# Patient Record
Sex: Male | Born: 1935 | Race: White | Hispanic: No | Marital: Married | Smoking: Former smoker
Health system: Southern US, Community
[De-identification: ages and names within clinical notes are randomized; demographics above are authoritative.]

## PROBLEM LIST (undated history)

## (undated) DIAGNOSIS — E875 Hyperkalemia: Secondary | ICD-10-CM

## (undated) DIAGNOSIS — I872 Venous insufficiency (chronic) (peripheral): Secondary | ICD-10-CM

## (undated) DIAGNOSIS — I10 Essential (primary) hypertension: Secondary | ICD-10-CM

## (undated) DIAGNOSIS — M1990 Unspecified osteoarthritis, unspecified site: Secondary | ICD-10-CM

## (undated) DIAGNOSIS — I5189 Other ill-defined heart diseases: Secondary | ICD-10-CM

## (undated) DIAGNOSIS — IMO0001 Reserved for inherently not codable concepts without codable children: Secondary | ICD-10-CM

## (undated) DIAGNOSIS — H44009 Unspecified purulent endophthalmitis, unspecified eye: Secondary | ICD-10-CM

## (undated) DIAGNOSIS — I2510 Atherosclerotic heart disease of native coronary artery without angina pectoris: Secondary | ICD-10-CM

## (undated) HISTORY — DX: Unspecified purulent endophthalmitis, unspecified eye: H44.009

## (undated) HISTORY — PX: TOTAL KNEE ARTHROPLASTY: SHX125

## (undated) HISTORY — PX: ENUCLEATION: SHX628

## (undated) HISTORY — PX: GALLBLADDER SURGERY: SHX652

## (undated) HISTORY — DX: Essential (primary) hypertension: I10

## (undated) HISTORY — DX: Other ill-defined heart diseases: I51.89

## (undated) HISTORY — PX: HERNIA REPAIR: SHX51

## (undated) HISTORY — DX: Hyperkalemia: E87.5

## (undated) HISTORY — PX: PROSTATE SURGERY: SHX751

## (undated) HISTORY — DX: Venous insufficiency (chronic) (peripheral): I87.2

## (undated) HISTORY — DX: Morbid (severe) obesity due to excess calories: E66.01

---

## 1999-11-07 ENCOUNTER — Encounter: Payer: Self-pay | Admitting: Emergency Medicine

## 1999-11-07 ENCOUNTER — Emergency Department (HOSPITAL_COMMUNITY): Admission: EM | Admit: 1999-11-07 | Discharge: 1999-11-07 | Payer: Self-pay | Admitting: Emergency Medicine

## 2000-05-02 ENCOUNTER — Ambulatory Visit (HOSPITAL_COMMUNITY): Admission: RE | Admit: 2000-05-02 | Discharge: 2000-05-03 | Payer: Self-pay | Admitting: Ophthalmology

## 2001-07-02 ENCOUNTER — Encounter: Payer: Self-pay | Admitting: Ophthalmology

## 2001-07-02 ENCOUNTER — Ambulatory Visit (HOSPITAL_COMMUNITY): Admission: RE | Admit: 2001-07-02 | Discharge: 2001-07-02 | Payer: Self-pay | Admitting: Ophthalmology

## 2002-08-19 ENCOUNTER — Encounter: Payer: Self-pay | Admitting: Ophthalmology

## 2002-08-20 ENCOUNTER — Ambulatory Visit (HOSPITAL_COMMUNITY): Admission: RE | Admit: 2002-08-20 | Discharge: 2002-08-21 | Payer: Self-pay | Admitting: Ophthalmology

## 2002-11-12 ENCOUNTER — Ambulatory Visit (HOSPITAL_COMMUNITY): Admission: RE | Admit: 2002-11-12 | Discharge: 2002-11-12 | Payer: Self-pay | Admitting: Ophthalmology

## 2003-03-11 ENCOUNTER — Ambulatory Visit (HOSPITAL_COMMUNITY): Admission: RE | Admit: 2003-03-11 | Discharge: 2003-03-12 | Payer: Self-pay | Admitting: General Surgery

## 2003-03-11 ENCOUNTER — Encounter (INDEPENDENT_AMBULATORY_CARE_PROVIDER_SITE_OTHER): Payer: Self-pay | Admitting: *Deleted

## 2003-04-28 ENCOUNTER — Encounter: Admission: RE | Admit: 2003-04-28 | Discharge: 2003-04-28 | Payer: Self-pay | Admitting: Family Medicine

## 2004-12-28 ENCOUNTER — Inpatient Hospital Stay (HOSPITAL_BASED_OUTPATIENT_CLINIC_OR_DEPARTMENT_OTHER): Admission: RE | Admit: 2004-12-28 | Discharge: 2004-12-28 | Payer: Self-pay | Admitting: Cardiovascular Disease

## 2005-04-21 ENCOUNTER — Emergency Department (HOSPITAL_COMMUNITY): Admission: EM | Admit: 2005-04-21 | Discharge: 2005-04-21 | Payer: Self-pay | Admitting: Emergency Medicine

## 2006-06-03 ENCOUNTER — Ambulatory Visit: Payer: Self-pay | Admitting: Vascular Surgery

## 2007-12-22 ENCOUNTER — Ambulatory Visit: Payer: Self-pay | Admitting: *Deleted

## 2007-12-28 ENCOUNTER — Ambulatory Visit (HOSPITAL_COMMUNITY): Admission: RE | Admit: 2007-12-28 | Discharge: 2007-12-28 | Payer: Self-pay | Admitting: Cardiovascular Disease

## 2007-12-28 ENCOUNTER — Encounter: Payer: Self-pay | Admitting: Cardiovascular Disease

## 2008-11-02 ENCOUNTER — Emergency Department (HOSPITAL_COMMUNITY): Admission: EM | Admit: 2008-11-02 | Discharge: 2008-11-02 | Payer: Self-pay | Admitting: Emergency Medicine

## 2008-11-15 ENCOUNTER — Ambulatory Visit: Payer: Self-pay | Admitting: Vascular Surgery

## 2008-11-15 ENCOUNTER — Encounter (INDEPENDENT_AMBULATORY_CARE_PROVIDER_SITE_OTHER): Payer: Self-pay | Admitting: Orthopedic Surgery

## 2008-11-15 ENCOUNTER — Ambulatory Visit: Admission: RE | Admit: 2008-11-15 | Discharge: 2008-11-15 | Payer: Self-pay | Admitting: Orthopedic Surgery

## 2008-11-18 ENCOUNTER — Ambulatory Visit (HOSPITAL_COMMUNITY): Admission: RE | Admit: 2008-11-18 | Discharge: 2008-11-20 | Payer: Self-pay | Admitting: Orthopedic Surgery

## 2009-05-11 ENCOUNTER — Encounter: Admission: RE | Admit: 2009-05-11 | Discharge: 2009-05-11 | Payer: Self-pay | Admitting: Orthopedic Surgery

## 2009-05-16 ENCOUNTER — Inpatient Hospital Stay (HOSPITAL_COMMUNITY): Admission: RE | Admit: 2009-05-16 | Discharge: 2009-05-19 | Payer: Self-pay | Admitting: Orthopedic Surgery

## 2009-05-18 DIAGNOSIS — M86119 Other acute osteomyelitis, unspecified shoulder: Secondary | ICD-10-CM | POA: Insufficient documentation

## 2009-05-18 DIAGNOSIS — E875 Hyperkalemia: Secondary | ICD-10-CM | POA: Insufficient documentation

## 2009-05-18 DIAGNOSIS — D649 Anemia, unspecified: Secondary | ICD-10-CM | POA: Insufficient documentation

## 2009-05-19 ENCOUNTER — Ambulatory Visit: Payer: Self-pay | Admitting: Infectious Diseases

## 2009-05-19 ENCOUNTER — Encounter (INDEPENDENT_AMBULATORY_CARE_PROVIDER_SITE_OTHER): Payer: Self-pay | Admitting: Orthopedic Surgery

## 2009-05-19 ENCOUNTER — Ambulatory Visit: Payer: Self-pay | Admitting: Vascular Surgery

## 2009-05-25 ENCOUNTER — Encounter: Payer: Self-pay | Admitting: Infectious Diseases

## 2009-06-01 ENCOUNTER — Encounter: Payer: Self-pay | Admitting: Infectious Diseases

## 2009-06-07 ENCOUNTER — Encounter: Payer: Self-pay | Admitting: Infectious Diseases

## 2009-06-08 ENCOUNTER — Encounter: Payer: Self-pay | Admitting: Infectious Diseases

## 2009-06-15 ENCOUNTER — Encounter: Payer: Self-pay | Admitting: Infectious Diseases

## 2009-06-20 ENCOUNTER — Encounter (INDEPENDENT_AMBULATORY_CARE_PROVIDER_SITE_OTHER): Payer: Self-pay | Admitting: *Deleted

## 2009-06-20 DIAGNOSIS — N184 Chronic kidney disease, stage 4 (severe): Secondary | ICD-10-CM | POA: Insufficient documentation

## 2009-06-20 DIAGNOSIS — E785 Hyperlipidemia, unspecified: Secondary | ICD-10-CM | POA: Insufficient documentation

## 2009-06-20 DIAGNOSIS — E109 Type 1 diabetes mellitus without complications: Secondary | ICD-10-CM | POA: Insufficient documentation

## 2009-06-20 DIAGNOSIS — E669 Obesity, unspecified: Secondary | ICD-10-CM | POA: Insufficient documentation

## 2009-06-20 DIAGNOSIS — I10 Essential (primary) hypertension: Secondary | ICD-10-CM | POA: Insufficient documentation

## 2009-06-21 ENCOUNTER — Ambulatory Visit: Payer: Self-pay | Admitting: Infectious Diseases

## 2009-06-21 DIAGNOSIS — E119 Type 2 diabetes mellitus without complications: Secondary | ICD-10-CM | POA: Insufficient documentation

## 2009-06-21 DIAGNOSIS — N19 Unspecified kidney failure: Secondary | ICD-10-CM | POA: Insufficient documentation

## 2009-07-20 ENCOUNTER — Telehealth: Payer: Self-pay | Admitting: Infectious Disease

## 2009-08-07 ENCOUNTER — Ambulatory Visit: Payer: Self-pay | Admitting: Infectious Diseases

## 2009-08-11 LAB — CONVERTED CEMR LAB
BUN: 38 mg/dL — ABNORMAL HIGH (ref 6–23)
Basophils Absolute: 0 10*3/uL (ref 0.0–0.1)
Basophils Relative: 1 % (ref 0–1)
CRP: 0.1 mg/dL (ref <0.6)
Chloride: 105 meq/L (ref 96–112)
Creatinine, Ser: 1.87 mg/dL — ABNORMAL HIGH (ref 0.40–1.50)
Eosinophils Absolute: 0.5 10*3/uL (ref 0.0–0.7)
Glucose, Bld: 182 mg/dL — ABNORMAL HIGH (ref 70–99)
HCT: 41.3 % (ref 39.0–52.0)
Hemoglobin: 13.2 g/dL (ref 13.0–17.0)
MCHC: 32 g/dL (ref 30.0–36.0)
MCV: 96.9 fL (ref 78.0–100.0)
Platelets: 179 10*3/uL (ref 150–400)
RDW: 14.1 % (ref 11.5–15.5)
Sodium: 142 meq/L (ref 135–145)

## 2009-08-16 ENCOUNTER — Telehealth (INDEPENDENT_AMBULATORY_CARE_PROVIDER_SITE_OTHER): Payer: Self-pay | Admitting: *Deleted

## 2009-08-29 ENCOUNTER — Encounter: Admission: RE | Admit: 2009-08-29 | Discharge: 2009-08-29 | Payer: Self-pay | Admitting: Orthopedic Surgery

## 2009-10-16 ENCOUNTER — Ambulatory Visit: Payer: Self-pay | Admitting: Cardiovascular Disease

## 2009-10-16 LAB — LIPID PANEL
Cholesterol: 160 mg/dL (ref 0–200)
HDL: 43 mg/dL (ref 35–70)
LDL Cholesterol: 79 mg/dL
Triglycerides: 191 mg/dL — AB (ref 40–160)

## 2010-01-08 ENCOUNTER — Ambulatory Visit: Payer: Self-pay | Admitting: Cardiovascular Disease

## 2010-01-08 ENCOUNTER — Encounter: Payer: Self-pay | Admitting: Family Medicine

## 2010-02-22 LAB — BASIC METABOLIC PANEL
BUN: 30 mg/dL — AB (ref 4–21)
Creatinine: 1.9 mg/dL — AB (ref <=1.3)
Glucose: 59 mg/dL

## 2010-02-22 LAB — TSH: TSH: 0.73 u[IU]/mL (ref <=5.90)

## 2010-02-23 ENCOUNTER — Ambulatory Visit: Payer: Self-pay | Admitting: Cardiovascular Disease

## 2010-03-08 ENCOUNTER — Encounter: Payer: Self-pay | Admitting: Cardiovascular Disease

## 2010-03-08 DIAGNOSIS — I5032 Chronic diastolic (congestive) heart failure: Secondary | ICD-10-CM | POA: Insufficient documentation

## 2010-03-08 DIAGNOSIS — I872 Venous insufficiency (chronic) (peripheral): Secondary | ICD-10-CM | POA: Insufficient documentation

## 2010-03-08 DIAGNOSIS — I10 Essential (primary) hypertension: Secondary | ICD-10-CM | POA: Insufficient documentation

## 2010-03-08 DIAGNOSIS — E875 Hyperkalemia: Secondary | ICD-10-CM | POA: Insufficient documentation

## 2010-03-08 DIAGNOSIS — E119 Type 2 diabetes mellitus without complications: Secondary | ICD-10-CM | POA: Insufficient documentation

## 2010-03-08 DIAGNOSIS — Z905 Acquired absence of kidney: Secondary | ICD-10-CM | POA: Insufficient documentation

## 2010-03-08 DIAGNOSIS — H44009 Unspecified purulent endophthalmitis, unspecified eye: Secondary | ICD-10-CM | POA: Insufficient documentation

## 2010-03-11 ENCOUNTER — Encounter: Payer: Self-pay | Admitting: Cardiovascular Disease

## 2010-03-20 NOTE — Progress Notes (Signed)
Summary: Pt called symptoms returning.  Had stopped abx 5/19, to resume  Phone Note Call from Patient   Caller: Patient Summary of Call: Voicemail : having increased pain in arm.  left message to call. Tomasita Morrow RN  July 20, 2009 10:35 AM   Follow-up for Phone Call        Pt. called again.  Wanting to know whether Dr. Ninetta Lights would like him to have any blood work since the same symptoms have returned that started his shoulder problem.  RN to contact MD and call pt. back.  Pt. ph. # [phone].  Jennet Maduro RN  July 20, 2009 11:19 AM SPoke with Dr. Daiva Eves.  Pt. needs to call his orthopedist to return.  RN called pt.  Pt. saw the orthopedist in May and was released.  RN asked the pt. whether he was still taking the Cipro.  Pt. stated that he completed 15 days of Cipro and stopped.  RN asked why he did not receive refills of the medication.  Pt. was told by [facility] that he did not have refills.  RN will call [facility] to check on refills and order if necessary.  Will page Dr Daiva Eves about ordering any labwork which the pt. feels is necessary.  RN stated that she would call the pt. back today. Jennet Maduro RN  July 20, 2009 12:40 PM RN spoke with Dr. Algis Liming.  Doesn't feel that the pt. needs labwork. Wants the pt. to resume taking the antibiotic ASAP, make appt.with Dr. Ranell Patrick for exam and return with Dr. Ninetta Lights on 08/07/09.  RN called pt. and relayed these instructions.  Pt. verbalized understanding. Jennet Maduro RN  July 20, 2009 1:00 PM   Additional Follow-up for Phone Call Additional follow up Details #1::        Thanks [facility]  Additional Follow-up by: Acey Lav MD,  July 21, 2009 2:49 PM

## 2010-03-20 NOTE — Assessment & Plan Note (Signed)
Summary: 6WK F/U/VS   CC:  6 week follow up.  History of Present Illness: 77 yo M with hx of R shoulder rotator cuff repair Oct 2010. He returned March 2011 with decreased range of motion and pain. He had an MRI showing " Intense marrow edema about 2 screws in the proximal humerus worrisome for osteomyelitis." he was adm to the hospital on 05-16-09 and underwent I & D same day. His Cx grew P aeruginosa (pan-sensitive).  ESR 121, CRP 23.8 (05-19-09).  He was treated with IV vancomycin for ? date. He would complete 6 weeks from d/c at Jun 27, 2009.  F/U ESR 2 and CRP 0.2 06-08-09.  he tokk only 14 days of cipro and then had recurrent pain. he called the ID clinic and his cipro was refilled (has 10 days remaining). and he was to f/u with his orthopedist. and has since had further surgery on his L eye 07-28-09.  has some continued pain in his shoulder especailly with leaning on it. it "cracks" when he does this. no fever or chills, no swelling in shoulder. pain in shoulder resolves with rotation of shoulder. states he has FROM.  has f/u with his kidney dr at [facility] and most Cr was 2.5 .    Preventive Screening-Counseling & Management  Alcohol-Tobacco     Alcohol drinks/day: 0     Smoking Status: quit  Caffeine-Diet-Exercise     Caffeine use/day: coffee     Does Patient Exercise: yes     Type of exercise: PT done by patient  Safety-Violence-Falls     Seat Belt Use: yes   Updated Prior Medication List: LANTUS 100 UNIT/ML SOLN (INSULIN GLARGINE) Inject 25 units subcutaneously every morning per PCP NOVOLOG 100 UNIT/ML SOLN (INSULIN ASPART) Inject 10 units three times a day before meals per PCP FUROSEMIDE 80 MG TABS (FUROSEMIDE) Take 1 tablet by mouth once a day in the AM per PCP CARVEDILOL 12.5 MG TABS (CARVEDILOL) Take 1 tablet by mouth two times a day per PCP LISINOPRIL 20 MG TABS (LISINOPRIL) Take 1 tablet by mouth two times a day per PCP MULTIVITAMINS  TABS (MULTIPLE VITAMIN) Take 1 tablet by  mouth once a day per PCP ASPIRIN 81 MG TABS (ASPIRIN) Take 1 tablet by mouth once a day per PCP SIMVASTATIN 40 MG TABS (SIMVASTATIN) Take 1 tablet by mouth at bedtime per PCP HECTOROL 0.5 MCG CAPS (DOXERCALCIFEROL) Take one capsule by mouth on Monday, Wednesday and Friday per PCP CIPRO 500 MG TABS (CIPROFLOXACIN HCL) Take 1 tablet by mouth two times a day  Current Allergies (reviewed today): ! NEOMYCIN-POLYMYXIN-DEXAMETH [facility]) Past History:  Past medical, surgical, family and social histories (including risk factors) reviewed, and no changes noted (except as noted below).  Past Medical History: Reviewed history from 06/21/2009 and no changes required. Current Problems:  RENAL FAILURE (ICD-586) DIABETES MELLITUS, TYPE II (ICD-250.00) HYPERLIPIDEMIA (ICD-272.4) OBESITY (ICD-278.00) HYPERTENSION (ICD-401.9) ANEMIA (ICD-285.9) CHRONIC KIDNEY DISEASE STAGE III (MODERATE) (ICD-585.3) HYPERKALEMIA (ICD-276.7) IDDM (ICD-250.01) ACUTE OSTEOMYELITIS SHOULDER REGION (ICD-730.01) Blind L eye  Family History: Reviewed history from 06/21/2009 and no changes required. Family History of Alcoholism/Addiction- father Family History Diabetes 1st degree relative- mother  Social History: Reviewed history from 06/21/2009 and no changes required. Former Smoker Alcohol use-yes- occasional  Vital Signs:  Patient profile:   77 year old male Height:      63 inches (160.02 cm) Weight:      246.12 pounds (111.87 kg) BMI:     43.76 Temp:  97.7 degrees F (36.50 degrees C) oral Pulse rate:   60 / minute BP sitting:   143 / 80  (left arm)  Vitals Entered By: Baxter Hire) (August 07, 2009 10:49 AM) CC: 6 week follow up Pain Assessment Patient in pain? no      Nutritional Status BMI of > 30 = obese Nutritional Status Detail appetite is okay per patient  Does patient need assistance? Functional Status Self care Ambulation Normal   Physical Exam  General:   well-developed, well-nourished, well-hydrated, and overweight-appearing.   Extremities:  R shoulder non-tender, FROM, no increase in heat, no swelling.    Impression & Recommendations:  Problem # 1:  ACUTE OSTEOMYELITIS SHOULDER REGION (ICD-730.01)  will recheck his CRP and ESR and WBC.  will plan to stop his meds at planned d/c (10 days from today) if his CRP and ESR are normal. If they are elavated with continue his meds. will cal lpt with results if abn.  His most recent HgBAIC was 7%. Has had 2 hypoglycemic episodes in last. has f/u with endo His updated medication list for this problem includes:    Aspirin 81 Mg Tabs (Aspirin) .Marland Kitchen... Take 1 tablet by mouth once a day per pcp    Cipro 500 Mg Tabs (Ciprofloxacin hcl) .Marland Kitchen... Take 1 tablet by mouth two times a day  Orders: T-C-Reactive Protein [phone]) T-CBC w/Diff [phone]) T-Basic Metabolic Panel [phone]) T-Sed Rate (Automated) ([id number]) Est. Patient Level III (40102)

## 2010-03-20 NOTE — Letter (Signed)
Summary: [facility]   Imported By: Maryln Gottron 01/19/2010 10:29:37  _____________________________________________________________________  External Attachment:    Type:   Image     Comment:   External Document

## 2010-03-20 NOTE — Miscellaneous (Signed)
Summary: Problems, Medications and Allergies updated  Clinical Lists Changes  Problems: Added new problem of ACUTE OSTEOMYELITIS SHOULDER REGION (ICD-730.01) - right shoulder Added new problem of IDDM (ICD-250.01) Added new problem of HYPERKALEMIA (ICD-276.7) Added new problem of CHRONIC KIDNEY DISEASE STAGE III (MODERATE) (ICD-585.3) Added new problem of ANEMIA (ICD-285.9) Added new problem of HYPERTENSION (ICD-401.9) Added new problem of OBESITY (ICD-278.00) Added new problem of HYPERLIPIDEMIA (ICD-272.4) Medications: Added new medication of LANTUS 100 UNIT/ML SOLN (INSULIN GLARGINE) Inject 25 units subcutaneously every morning per PCP Added new medication of NOVOLOG 100 UNIT/ML SOLN (INSULIN ASPART) Inject 10 units three times a day before meals per PCP Added new medication of FUROSEMIDE 80 MG TABS (FUROSEMIDE) Take 1 tablet by mouth once a day in the AM per PCP Added new medication of CARVEDILOL 12.5 MG TABS (CARVEDILOL) Take 1 tablet by mouth two times a day per PCP Added new medication of LISINOPRIL 20 MG TABS (LISINOPRIL) Take 1 tablet by mouth two times a day per PCP Added new medication of MULTIVITAMINS  TABS (MULTIPLE VITAMIN) Take 1 tablet by mouth once a day per PCP Added new medication of ASPIRIN 81 MG TABS (ASPIRIN) Take 1 tablet by mouth once a day per PCP Added new medication of SIMVASTATIN 40 MG TABS (SIMVASTATIN) Take 1 tablet by mouth at bedtime per PCP Added new medication of HECTOROL 0.5 MCG CAPS (DOXERCALCIFEROL) Take one capsule by mouth on Monday, Wednesday and Friday per PCP Allergies: Added new allergy or adverse reaction of NEOMYCIN-POLYMYXIN-DEXAMETH [facility]) Observations: Added new observation of NKA: F (06/20/2009 9:22)

## 2010-03-20 NOTE — Miscellaneous (Signed)
Summary: HIPAA Restrictions  HIPAA Restrictions   Imported By: Florinda Marker 06/21/2009 14:44:56  _____________________________________________________________________  External Attachment:    Type:   Image     Comment:   External Document

## 2010-03-20 NOTE — Progress Notes (Signed)
Summary: Call to inform pt. of labwork  Phone Note Outgoing Call   Call placed by: Jennet Maduro RN,  August 16, 2009 4:01 PM Call placed to: Patient Action Taken: Phone Call Completed Summary of Call: Call to pt. to inform about lab work.  Pt. had previously been call by this office and was instructed to stop his medications 2 days ago.  RN apologized for the confusion about when to stop his antibiotics.  Pt. stated he would watch how his shoulder felt and would call this office if there were a problem.  Jennet Maduro RN  August 16, 2009 4:04 PM

## 2010-03-20 NOTE — Assessment & Plan Note (Signed)
Summary: hsfu/need chart/kam   Vital Signs:  Patient profile:   77 year old male Height:      63 inches (160.02 cm) Weight:      244.12 pounds (110.96 kg) BMI:     43.40 Temp:     97.8 degrees F (36.56 degrees C) oral Pulse rate:   83 / minute BP sitting:   131 / 75  (right arm)  Vitals Entered By: Baxter Hire) (Jun 21, 2009 11:26 AM) CC: hsfu Pain Assessment Patient in pain? yes     Location: right shoulder Intensity: will not answer Onset of pain  pain comes and goes Nutritional Status BMI of > 30 = obese Nutritional Status Detail appetite is okay per patient  Does patient need assistance? Functional Status Self care Ambulation Normal   CC:  hsfu.  History of Present Illness: 77 yo M with hx of R shoulder rotator cuff repair Oct 2010. He returned March 2011 with decreased range of motion and pain. He had an MRI showing " Intense marrow edema about 2 screws in the proximal humerus worrisome for osteomyelitis." he was adm to the hospital on 05-16-09 and underwent I & D same day. His Cx grew P aeruginosa (pan-sensitive).  ESR 121, CRP 23.8 (05-19-09).  He was treated with IV vancomycin for ? date. He would complete 6 weeks from d/c at Jun 27, 2009.  Cont to have some pain in his shoulder, occas. Has had some redness in his PIC, occas swelling.    F/U ESR 2 and CRP 0.2 06-08-09.   Preventive Screening-Counseling & Management  Alcohol-Tobacco     Alcohol drinks/day: 0     Smoking Status: quit  Caffeine-Diet-Exercise     Caffeine use/day: coffee     Does Patient Exercise: yes     Type of exercise: PT done by patient  Safety-Violence-Falls     Seat Belt Use: yes  Current Medications (verified): 1)  Lantus 100 Unit/ml Soln (Insulin Glargine) .... Inject 25 Units Subcutaneously Every Morning Per Pcp 2)  Novolog 100 Unit/ml Soln (Insulin Aspart) .... Inject 10 Units Three Times A Day Before Meals Per Pcp 3)  Furosemide 80 Mg Tabs (Furosemide) .... Take 1 Tablet By  Mouth Once A Day in The Am Per Pcp 4)  Carvedilol 12.5 Mg Tabs (Carvedilol) .... Take 1 Tablet By Mouth Two Times A Day Per Pcp 5)  Lisinopril 20 Mg Tabs (Lisinopril) .... Take 1 Tablet By Mouth Two Times A Day Per Pcp 6)  Multivitamins  Tabs (Multiple Vitamin) .... Take 1 Tablet By Mouth Once A Day Per Pcp 7)  Aspirin 81 Mg Tabs (Aspirin) .... Take 1 Tablet By Mouth Once A Day Per Pcp 8)  Simvastatin 40 Mg Tabs (Simvastatin) .... Take 1 Tablet By Mouth At Bedtime Per Pcp 9)  Hectorol 0.5 Mcg Caps (Doxercalciferol) .... Take One Capsule By Mouth On Monday, Wednesday and Friday Per Pcp 10)  Vancomycin Hcl 1000 Mg Solr (Vancomycin Hcl)  Allergies: 1)  ! Neomycin-Polymyxin-Dexameth (Neomycin-Polymyxin-Dexameth)  Past History:  Past Medical History: Current Problems:  RENAL FAILURE (ICD-586) DIABETES MELLITUS, TYPE II (ICD-250.00) HYPERLIPIDEMIA (ICD-272.4) OBESITY (ICD-278.00) HYPERTENSION (ICD-401.9) ANEMIA (ICD-285.9) CHRONIC KIDNEY DISEASE STAGE III (MODERATE) (ICD-585.3) HYPERKALEMIA (ICD-276.7) IDDM (ICD-250.01) ACUTE OSTEOMYELITIS SHOULDER REGION (ICD-730.01) Blind L eye  Family History: Family History of Alcoholism/Addiction- father Family History Diabetes 1st degree relative- mother  Social History: Former Smoker Alcohol use-yes- occasional  Review of Systems  The patient denies fever.  chills, worsening vision in R eye.   Physical Exam  General:  well-developed, well-nourished, and well-hydrated.   Eyes:  pupils equal and pupils round.   Mouth:  pharynx pink and moist and no exudates.   Lungs:  normal respiratory effort and normal breath sounds.   Heart:  normal rate, regular rhythm, and no murmur.   Abdomen:  soft, non-tender, and normal bowel sounds.   Extremities:  minimal erythema at [facility]. no d/c.  R shoulder- non-tender, no erythema, no fluctuance, some limitation of motion to adduction superiorly.    Impression & Recommendations:  Problem  # 1:  ACUTE OSTEOMYELITIS SHOULDER REGION (ICD-730.01)  His cx showed Pseudomonas but he was treated with vanco and has improved dramatically. the records that are avaible in the clinic are limited to the hospital computer only.  will add 6 week course of by mouth cipro to his meds. will see him back in 6 weeks to see his progress.  pull pic today.  His updated medication list for this problem includes:    Aspirin 81 Mg Tabs (Aspirin) .Marland Kitchen... Take 1 tablet by mouth once a day per pcp    Vancomycin Hcl 1000 Mg Solr (Vancomycin hcl)    Cipro 500 Mg Tabs (Ciprofloxacin hcl) .Marland Kitchen... Take 1 tablet by mouth two times a day  Orders: New Patient Level IV (06269)  Medications Added to Medication List This Visit: 1)  Vancomycin Hcl 1000 Mg Solr (Vancomycin hcl) 2)  Cipro 500 Mg Tabs (Ciprofloxacin hcl) .... Take 1 tablet by mouth two times a day Prescriptions: CIPRO 500 MG TABS (CIPROFLOXACIN HCL) Take 1 tablet by mouth two times a day  #30 x 2   Entered and Authorized by:   Johny Sax MD   Signed by:   Johny Sax MD on 06/21/2009   Method used:   Print then Give to Patient   RxID:   [id number]    Appended Document: hsfu/need chart/kam PIC dressing removed, site unremarkable.  Pt. taking ASA 81 mg.  PIC line removed using sterile procedure @ 12:42 pm  PIC length equal to that noted in pt's hospital chart of 52.5 cm.  Sterile petroleum gauze + sterile 4X4 applied to PIC site, pressure applied for 8 minutes and covered with Medipore take as a pressure bandage.  Pt. instructed to limit use of arm for 1 hour.  Pt. instructed the the pressure dressing should remain in place for 24 hours.  Pt. and wife verablized understanding of these instructions.

## 2010-03-30 ENCOUNTER — Ambulatory Visit (INDEPENDENT_AMBULATORY_CARE_PROVIDER_SITE_OTHER): Payer: Medicare Other | Admitting: Cardiovascular Disease

## 2010-03-30 DIAGNOSIS — E7800 Pure hypercholesterolemia, unspecified: Secondary | ICD-10-CM

## 2010-03-30 DIAGNOSIS — I119 Hypertensive heart disease without heart failure: Secondary | ICD-10-CM

## 2010-03-30 DIAGNOSIS — E119 Type 2 diabetes mellitus without complications: Secondary | ICD-10-CM

## 2010-05-09 LAB — GLUCOSE, CAPILLARY
Glucose-Capillary: 129 mg/dL — ABNORMAL HIGH (ref 70–99)
Glucose-Capillary: 152 mg/dL — ABNORMAL HIGH (ref 70–99)

## 2010-05-09 LAB — DIFFERENTIAL
Basophils Relative: 1 % (ref 0–1)
Eosinophils Absolute: 0.5 10*3/uL (ref 0.0–0.7)
Eosinophils Relative: 7 % — ABNORMAL HIGH (ref 0–5)
Lymphocytes Relative: 8 % — ABNORMAL LOW (ref 12–46)
Monocytes Absolute: 0.9 10*3/uL (ref 0.1–1.0)
Neutrophils Relative %: 71 % (ref 43–77)

## 2010-05-09 LAB — BASIC METABOLIC PANEL
BUN: 51 mg/dL — ABNORMAL HIGH (ref 6–23)
CO2: 25 mEq/L (ref 19–32)
GFR calc Af Amer: 37 mL/min — ABNORMAL LOW (ref >60)
GFR calc non Af Amer: 30 mL/min — ABNORMAL LOW (ref >60)
Glucose, Bld: 149 mg/dL — ABNORMAL HIGH (ref 70–99)
Potassium: 4.9 mEq/L (ref 3.5–5.1)
Potassium: 5 mEq/L (ref 3.5–5.1)
Sodium: 136 mEq/L (ref 135–145)
Sodium: 138 mEq/L (ref 135–145)

## 2010-05-09 LAB — CBC
HCT: 30.3 % — ABNORMAL LOW (ref 39.0–52.0)
MCV: 95.4 fL (ref 78.0–100.0)
Platelets: 233 10*3/uL (ref 150–400)
RDW: 13.5 % (ref 11.5–15.5)
WBC: 6.4 10*3/uL (ref 4.0–10.5)

## 2010-05-09 LAB — SEDIMENTATION RATE: Sed Rate: 121 mm/hr — ABNORMAL HIGH (ref 0–16)

## 2010-05-09 LAB — VANCOMYCIN, TROUGH: Vancomycin Tr: 27 ug/mL (ref 10.0–20.0)

## 2010-05-14 LAB — BASIC METABOLIC PANEL
BUN: 53 mg/dL — ABNORMAL HIGH (ref 6–23)
CO2: 26 mEq/L (ref 19–32)
Calcium: 8.5 mg/dL (ref 8.4–10.5)
Calcium: 8.8 mg/dL (ref 8.4–10.5)
Calcium: 9 mg/dL (ref 8.4–10.5)
Chloride: 102 mEq/L (ref 96–112)
Chloride: 104 mEq/L (ref 96–112)
Creatinine, Ser: 2.46 mg/dL — ABNORMAL HIGH (ref 0.4–1.5)
GFR calc non Af Amer: 26 mL/min — ABNORMAL LOW (ref >60)
GFR calc non Af Amer: 26 mL/min — ABNORMAL LOW (ref >60)
GFR calc non Af Amer: 31 mL/min — ABNORMAL LOW (ref >60)
Glucose, Bld: 177 mg/dL — ABNORMAL HIGH (ref 70–99)
Glucose, Bld: 220 mg/dL — ABNORMAL HIGH (ref 70–99)
Glucose, Bld: 242 mg/dL — ABNORMAL HIGH (ref 70–99)
Glucose, Bld: 293 mg/dL — ABNORMAL HIGH (ref 70–99)
Potassium: 4.9 mEq/L (ref 3.5–5.1)
Potassium: 5.9 mEq/L — ABNORMAL HIGH (ref 3.5–5.1)
Sodium: 134 mEq/L — ABNORMAL LOW (ref 135–145)
Sodium: 134 mEq/L — ABNORMAL LOW (ref 135–145)

## 2010-05-14 LAB — GLUCOSE, CAPILLARY
Glucose-Capillary: 147 mg/dL — ABNORMAL HIGH (ref 70–99)
Glucose-Capillary: 160 mg/dL — ABNORMAL HIGH (ref 70–99)
Glucose-Capillary: 160 mg/dL — ABNORMAL HIGH (ref 70–99)
Glucose-Capillary: 184 mg/dL — ABNORMAL HIGH (ref 70–99)
Glucose-Capillary: 195 mg/dL — ABNORMAL HIGH (ref 70–99)
Glucose-Capillary: 197 mg/dL — ABNORMAL HIGH (ref 70–99)
Glucose-Capillary: 205 mg/dL — ABNORMAL HIGH (ref 70–99)
Glucose-Capillary: 215 mg/dL — ABNORMAL HIGH (ref 70–99)
Glucose-Capillary: 251 mg/dL — ABNORMAL HIGH (ref 70–99)
Glucose-Capillary: 280 mg/dL — ABNORMAL HIGH (ref 70–99)
Glucose-Capillary: 56 mg/dL — ABNORMAL LOW (ref 70–99)
Glucose-Capillary: 80 mg/dL (ref 70–99)

## 2010-05-14 LAB — CBC
HCT: 30.9 % — ABNORMAL LOW (ref 39.0–52.0)
Hemoglobin: 10.1 g/dL — ABNORMAL LOW (ref 13.0–17.0)
MCV: 95.4 fL (ref 78.0–100.0)
Platelets: 180 10*3/uL (ref 150–400)
Platelets: 219 10*3/uL (ref 150–400)
RBC: 3.8 MIL/uL — ABNORMAL LOW (ref 4.22–5.81)
RDW: 14 % (ref 11.5–15.5)
RDW: 14.1 % (ref 11.5–15.5)

## 2010-05-14 LAB — URINALYSIS, ROUTINE W REFLEX MICROSCOPIC
Hgb urine dipstick: NEGATIVE
Nitrite: NEGATIVE
Specific Gravity, Urine: 1.018 (ref 1.005–1.030)
Urobilinogen, UA: 1 mg/dL (ref 0.0–1.0)
pH: 5 (ref 5.0–8.0)

## 2010-05-14 LAB — DIFFERENTIAL
Lymphocytes Relative: 5 % — ABNORMAL LOW (ref 12–46)
Lymphs Abs: 0.3 10*3/uL — ABNORMAL LOW (ref 0.7–4.0)
Monocytes Absolute: 0.9 10*3/uL (ref 0.1–1.0)
Monocytes Relative: 11 % (ref 3–12)
Neutro Abs: 6.5 10*3/uL (ref 1.7–7.7)
Neutrophils Relative %: 82 % — ABNORMAL HIGH (ref 43–77)

## 2010-05-14 LAB — TISSUE CULTURE: Gram Stain: NONE SEEN

## 2010-05-14 LAB — PROTIME-INR: INR: 1.17 (ref 0.00–1.49)

## 2010-05-14 LAB — ANAEROBIC CULTURE

## 2010-05-14 LAB — GRAM STAIN

## 2010-05-14 LAB — URINE MICROSCOPIC-ADD ON

## 2010-05-14 LAB — BODY FLUID CULTURE

## 2010-05-14 LAB — TYPE AND SCREEN: ABO/RH(D): O POS

## 2010-05-24 LAB — BASIC METABOLIC PANEL
BUN: 50 mg/dL — ABNORMAL HIGH (ref 6–23)
BUN: 54 mg/dL — ABNORMAL HIGH (ref 6–23)
CO2: 28 mEq/L (ref 19–32)
Calcium: 8.4 mg/dL (ref 8.4–10.5)
Chloride: 100 mEq/L (ref 96–112)
Creatinine, Ser: 2.55 mg/dL — ABNORMAL HIGH (ref 0.4–1.5)
GFR calc non Af Amer: 25 mL/min — ABNORMAL LOW (ref >60)
Glucose, Bld: 207 mg/dL — ABNORMAL HIGH (ref 70–99)
Glucose, Bld: 235 mg/dL — ABNORMAL HIGH (ref 70–99)
Potassium: 4.9 mEq/L (ref 3.5–5.1)
Potassium: 5.3 mEq/L — ABNORMAL HIGH (ref 3.5–5.1)
Sodium: 134 mEq/L — ABNORMAL LOW (ref 135–145)

## 2010-05-24 LAB — CBC
HCT: 33.5 % — ABNORMAL LOW (ref 39.0–52.0)
Hemoglobin: 11.3 g/dL — ABNORMAL LOW (ref 13.0–17.0)
MCHC: 33.3 g/dL (ref 30.0–36.0)
MCV: 96.7 fL (ref 78.0–100.0)
MCV: 98.3 fL (ref 78.0–100.0)
Platelets: 151 10*3/uL (ref 150–400)
Platelets: 172 10*3/uL (ref 150–400)
RDW: 13.9 % (ref 11.5–15.5)
RDW: 14.2 % (ref 11.5–15.5)
WBC: 9 10*3/uL (ref 4.0–10.5)

## 2010-05-24 LAB — GLUCOSE, CAPILLARY
Glucose-Capillary: 178 mg/dL — ABNORMAL HIGH (ref 70–99)
Glucose-Capillary: 181 mg/dL — ABNORMAL HIGH (ref 70–99)
Glucose-Capillary: 205 mg/dL — ABNORMAL HIGH (ref 70–99)
Glucose-Capillary: 213 mg/dL — ABNORMAL HIGH (ref 70–99)
Glucose-Capillary: 228 mg/dL — ABNORMAL HIGH (ref 70–99)
Glucose-Capillary: 234 mg/dL — ABNORMAL HIGH (ref 70–99)

## 2010-05-25 LAB — CBC
HCT: 36.7 % — ABNORMAL LOW (ref 39.0–52.0)
Platelets: 169 10*3/uL (ref 150–400)
RDW: 13.8 % (ref 11.5–15.5)
WBC: 5.9 10*3/uL (ref 4.0–10.5)

## 2010-05-25 LAB — URINALYSIS, ROUTINE W REFLEX MICROSCOPIC
Glucose, UA: NEGATIVE mg/dL
Ketones, ur: NEGATIVE mg/dL
Protein, ur: NEGATIVE mg/dL
Urobilinogen, UA: 0.2 mg/dL (ref 0.0–1.0)

## 2010-05-25 LAB — BASIC METABOLIC PANEL
BUN: 42 mg/dL — ABNORMAL HIGH (ref 6–23)
CO2: 31 mEq/L (ref 19–32)
Chloride: 104 mEq/L (ref 96–112)
Creatinine, Ser: 1.65 mg/dL — ABNORMAL HIGH (ref 0.4–1.5)
GFR calc Af Amer: 50 mL/min — ABNORMAL LOW (ref >60)
Potassium: 4.7 mEq/L (ref 3.5–5.1)

## 2010-05-25 LAB — DIFFERENTIAL
Basophils Absolute: 0 10*3/uL (ref 0.0–0.1)
Eosinophils Relative: 7 % — ABNORMAL HIGH (ref 0–5)
Lymphocytes Relative: 13 % (ref 12–46)
Lymphs Abs: 0.7 10*3/uL (ref 0.7–4.0)
Neutro Abs: 3.9 10*3/uL (ref 1.7–7.7)
Neutrophils Relative %: 67 % (ref 43–77)

## 2010-05-25 LAB — PROTIME-INR
INR: 1 (ref 0.00–1.49)
Prothrombin Time: 12.8 s (ref 11.6–15.2)

## 2010-06-13 ENCOUNTER — Other Ambulatory Visit: Payer: Self-pay | Admitting: *Deleted

## 2010-06-13 DIAGNOSIS — Z79899 Other long term (current) drug therapy: Secondary | ICD-10-CM

## 2010-06-29 ENCOUNTER — Other Ambulatory Visit (INDEPENDENT_AMBULATORY_CARE_PROVIDER_SITE_OTHER): Payer: PRIVATE HEALTH INSURANCE | Admitting: *Deleted

## 2010-06-29 ENCOUNTER — Ambulatory Visit (INDEPENDENT_AMBULATORY_CARE_PROVIDER_SITE_OTHER): Payer: Medicare Other | Admitting: Cardiovascular Disease

## 2010-06-29 ENCOUNTER — Encounter: Payer: Self-pay | Admitting: Cardiology

## 2010-06-29 DIAGNOSIS — Z79899 Other long term (current) drug therapy: Secondary | ICD-10-CM

## 2010-06-29 DIAGNOSIS — I519 Heart disease, unspecified: Secondary | ICD-10-CM

## 2010-06-29 DIAGNOSIS — I5189 Other ill-defined heart diseases: Secondary | ICD-10-CM

## 2010-06-29 NOTE — Assessment & Plan Note (Signed)
Roy Kane Is doing very well from a cardiac standpoint. I am pleased that she's not had any episodes of chest pain or shortness of breath. He has not been able to exercise very much but is unable doing well.

## 2010-06-29 NOTE — Progress Notes (Signed)
Roy Kane Date of Birth  1932/10/31 [facility] [address]     [address] [place], [place]  [address] [phone]  Fax  [phone]  History of Present Illness:  Roy Kane is an elderly gentleman with a history of diastolic congestive heart failure, obesity, renal cell carcinoma-status post nephrectomy, hyperkalemia, hypertension, diabetes mellitus, and venous insufficiency. He's done very well since I last saw him. He's not had any complaints. He was recently seen by his nephrologist and his lisinopril was restarted. We have discontinued it several months ago because of hyperkalemia. Since that time, he has stopped drinking so much fruit juice.  Current Outpatient Prescriptions on File Prior to Visit  Medication Sig Dispense Refill  . Calcium Carbonate Antacid (TUMS PO) Take by mouth as needed.        . carvedilol (COREG) 12.5 MG tablet Take 12.5 mg by mouth 2 (two) times daily.        Marland Kitchen doxazosin (CARDURA) 8 MG tablet Take 8 mg by mouth daily.        Marland Kitchen doxercalciferol (HECTOROL) 0.5 MCG capsule Take 1 mcg by mouth 3 (three) times a week.        . furosemide (LASIX) 40 MG tablet Take 40 mg by mouth daily. Taking 60mg . daily      . insulin aspart (NOVOLOG) 100 UNIT/ML injection Inject into the skin 3 (three) times daily before meals. Using 70/30  30 units am and 16 units pm      . Multiple Vitamin (MULTIVITAMIN) tablet Take 1 tablet by mouth daily.        . prednisoLONE acetate (PRED FORTE) 1 % ophthalmic suspension 1 drop as needed.        . simvastatin (ZOCOR) 40 MG tablet Take 40 mg by mouth at bedtime.        Marland Kitchen DISCONTD: insulin glargine (LANTUS) 100 UNIT/ML injection Inject 16 Units into the skin at bedtime.          Allergies  Allergen Reactions  . Neomycin-Polymyxin-Dexameth     REACTION: eye ointment caused eye swelling    Past Medical History  Diagnosis Date  . Obesity, morbid   . Diastolic dysfunction     CHRONIC  . Renal  cell carcinoma   . S/p nephrectomy   . Hyperkalemia   . Hypertension   . Eye infection     CHRONIC LEFT EYE  . Diabetes mellitus   . Venous insufficiency     Past Surgical History  Procedure Date  . Total knee arthroplasty   . Hernia repair     BILATERAL  . Prostate surgery     History  Smoking status  . Former Smoker  . Types: Cigarettes  . Quit date: 02/19/1980  Smokeless tobacco  . Not on file    History  Alcohol Use     No family history on file.  Reviw of Systems:  Reviewed in the HPI.  All other systems are negative.  Physical Exam: BP 132/78  Pulse 66  Wt 239 lb (108.41 kg) The patient is alert and oriented x 3.  The mood and affect are normal.  The skin is warm and dry.  Color is normal.  The HEENT exam reveals that the sclera are nonicteric.  The mucous membranes are moist.  The carotids are 2+ without bruits.  There is no thyromegaly.  There is no JVD.  The lungs are clear.  The chest wall is non tender.  The  heart exam reveals a regular rate with a normal S1 and S2.  There are no murmurs, gallops, or rubs.  The PMI is not displaced.   Abdominal exam reveals good bowel sounds.  There is no guarding or rebound.  There is no hepatosplenomegaly or tenderness.  There are no masses.  Exam of the legs reveal no clubbing, cyanosis, or edema.  The legs are without rashes.  The distal pulses are intact.  Cranial nerves II - XII are intact.  Motor and sensory functions are intact.  The gait is normal.  Assessment / Plan:

## 2010-06-29 NOTE — Assessment & Plan Note (Signed)
He has restarted his lisinopril at low dose. He will have his potassium checked by his nephrologist. I've asked him to make sure that he does not eat a lot of potassium-containing foods.

## 2010-07-03 NOTE — Procedures (Signed)
CAROTID DUPLEX EXAM   INDICATION:  Amaurosis fugax.   HISTORY:  Diabetes:  Yes.  Cardiac:  No.  Hypertension:  Yes.  Smoking:  Previous.  Previous Surgery:  No.  CV History:  Amaurosis fugax 3 weeks ago.  Amaurosis Fugax Yes, Paresthesias No, Hemiparesis No.                                       RIGHT             LEFT  Brachial systolic pressure:         162               174  Brachial Doppler waveforms:         Normal            Normal  Vertebral direction of flow:        Antegrade         Antegrade  DUPLEX VELOCITIES (cm/sec)  CCA peak systolic                   122               119  ECA peak systolic                   178               128  ICA peak systolic                   67                89  ICA end diastolic                   10                11  PLAQUE MORPHOLOGY:                  Soft              None  PLAQUE AMOUNT:                      Minimal           None  PLAQUE LOCATION:                    ICA               None   IMPRESSION:  1. A 1-39% stenosis of the right internal carotid artery.  2. No evidence of stenosis noted in the left internal carotid artery.  3. No significant change noted in the Doppler velocities when compared      to previous exam on 06/03/2006.  4. Decreased visualization noted due to technical limitations.  5. Preliminary report was faxed to Dr. Harvie Bridge office on 12/22/2007      at 4:10.      ___________________________________________  P. Liliane Bade, M.D.   CH/MEDQ  D:  12/22/2007  T:  12/22/2007  Job:  [id number]

## 2010-07-06 NOTE — H&P (Signed)
Roy Kane, Roy Kane NO.:  [id number]   MEDICAL RECORD NO.:  [id number]                   PATIENT TYPE:  OIB   LOCATION:  2899                                 FACILITY:  [facility]   PHYSICIAN:  Guadelupe Sabin, M.D.             DATE OF BIRTH:  Feb 09, 1933   DATE OF ADMISSION:  08/20/2002  DATE OF DISCHARGE:                                HISTORY & PHYSICAL   HISTORY OF PRESENT ILLNESS:  This was a planned outpatient readmission of  this 77 year old white male admitted for advanced proliferative diabetic  retinopathy of the left eye with chronic and recurrent vitreous hemorrhage.   This patient has a long history of insulin-dependent diabetes mellitus and  secondary complications of progressive diabetic retinopathy, proliferative  type, in both eyes and subsequent cataract formation.  The patient has  undergone previous panretinal laser photocoagulation to both eyes and was  admitted on May 02, 2000, for complex posterior vitrectomy surgery with  endolaser photocoagulation and membrane peeling of the right eye.  Later the  patient was admitted on Jul 02, 2001, for cataract implant surgery of the  right eye.  The patient did well following these procedures with clearing of  the vitreous and no recurrent vitreous hemorrhage.  Visual acuity improved  to 20/50 in the operated eye with correction.  Recently, however, the  patient has been troubled with recurrent vitreous hemorrhage in the  unoperated left eye.  This has reduced his vision and caused him to be  unable to perform his usual visual tasks as well as he did before.  He  therefore has elected to proceed with similar vitrectomy surgery at this  time.  He was given oral discussion and printed information concerning the  procedure again and signed an informed consent.  Arrangements were made for  his outpatient admission at this time.   PAST MEDICAL HISTORY:  The patient is under the care of Teena Irani. Arlyce Dice,  M.D., and takes multiple medications under Dr. Marzetta Board direction, including  70/30 insulin 30 units twice a day.  His blood sugars have been relative  stable and he is felt to be in satisfactory condition for the proposed  surgery.   MEDICATIONS:  Other medications include:  1. Lipitor 40 mg at bedtime.  2. Lasix 40 mg a day.  3. Diovan 320 mg a day.  4. Cardura 4 mg at bedtime.  5. Spironolactone 25 mg a day.   REVIEW OF SYSTEMS:  No current cardiorespiratory complaints.   PHYSICAL EXAMINATION:  VITAL SIGNS:  As record on admission, blood pressure  159/80, temperature 97.4 degrees, heart rate 64, and respirations 18.  GENERAL APPEARANCE:  The patient is a pleasant, well-nourished, well-  developed, white male in no acute distress with decreased vision.  HEENT:  Visual acuity 20/50- right eye and 20/50- left eye.  Applanation  tonometry 13 mm in each eye.  On external ocular and slit lamp examination  and the eyes are white and clear with a clear cornea, deep and clear  anterior chamber.  A posterior chamber intraocular lens implant is present  in the right eye and slight nuclear cataract in the left eye.  Detailed  fundus examination of the left eye reveals residual chronic vitreous  hemorrhage and panretinal laser photocoagulation with some gaps.  There is  extensive preretinal membrane formation covering the optic nerve and  extending out into the periphery.  The macular area is attached.  The optic  nerve is sharply outlined and of good color with proliferation at the optic  nerve surface.  CHEST:  Lungs clear to auscultation.  HEART:  Normal sinus rhythm.  No cardiomegaly.  No murmurs.  ABDOMEN:  Negative.  EXTREMITIES:  Negative.   ADMISSION DIAGNOSIS:  1. Advanced proliferative diabetic retinopathy, both eyes.  2. Chronic and recurrent vitreous hemorrhage, left eye.  3. Pseudophakia, right eye.  4. Cataract, left eye.  5. Diabetes mellitus, insulin  dependent type.   SURGICAL PLAN:  Pars plana vitrectomy with vitreous infusion suction cutter  and additional endolaser photocoagulation.                                               Guadelupe Sabin, M.D.    HNJ/MEDQ  D:  08/20/2002  T:  08/20/2002  Job:  [id number]   cc:   Vincenza Hews, M.D.  [address]  [place]  [place] [address]  Fax: [phone]   Teena Irani. Arlyce Dice, M.D.  [address]  [place]  [place] [address]  Fax: [phone]   Vesta Mixer, M.D.  [address]  [place]  [place] [address]  Fax: [phone]    cc:   Vincenza Hews, M.D.  [address]  [place]  [place] [address]  Fax: [phone]   Teena Irani. Arlyce Dice, M.D.  [address]  [place]  [place] [address]  Fax: [phone]   Vesta Mixer, M.D.  [address]  [place]  [place] [address]  Fax: [phone]

## 2010-07-06 NOTE — H&P (Signed)
[place]  Patient:    Roy Kane, Roy Kane Visit Number: [id number] MRN: [id number]          Service Type: DSU Location: [facility] 2899 33 Attending Physician:  Ivor Messier Dictated by:   Guadelupe Sabin, M.D. Admit Date:  07/02/2001 Discharge Date: 07/02/2001   CC:         Dr. Alonna Minium any any associated lab data, EKG, or chest x-ray material             Dr. Vincenza Hews   History and Physical  REASON FOR ADMISSION:  This was a planned outpatient surgical readmission of this 77 year old white male admitted for cataract implant surgery of the right eye.  PRESENT ILLNESS:  This patient has a long history of diabetes mellitus insulin-dependent type and secondary complications of progressive diabetic retinopathy proliferative type in both eyes.  The patient has undergone previous panretinal laser photocoagulation to both eyes and was admitted on May 02, 2000 for complex posterior vitrectomy with endolaser photocoagulation of the right eye and membrane peeling as needed.  The patient tolerated the procedure but vision still remained poor as progressive nuclear cataract formation developed.  It was elected to proceed with cataract implant surgery at this time, hoping for further improvement of vision.  The prognosis, however, was guarded and carefully explained to the patient due to his underlying diabetic retinopathy.  PAST MEDICAL HISTORY:  The patient continues under the care of Dr. Alonna Minium.  The patient takes multiple medications under Dr. Nita Sells direction. He is felt by Dr. Arlyce Dice to be a satisfactory candidate for the proposed surgery under local anesthesia.  REVIEW OF SYSTEMS:  No cardiorespiratory complaints.  PHYSICAL EXAMINATION:  VITAL SIGNS:  Blood pressure 165/85, temperature 97, heart rate 69, respirations 16.  GENERAL APPEARANCE:  Patient is a pleasant, well-nourished, well-developed 77 year old white male  in no acute distress.  HEENT:  Eyes:  Visual acuity with best correction 20/70 right eye, 20/60 left eye.  Applanation tonometry:  17 mm right eye, 16 mm left eye.  External ocular and slit lamp examination:  The eyes are white and clear with nuclear cataract formation in both eyes, greater in the right than left eye.  Detailed fundus examination reveals cataract haze in both eyes.  The vitreous is clear at the present time with panretinal laser photocoagulation.  Old proliferative diabetic retinopathy is present in both eyes but appears inactive.  The optic nerve is sharply outlined, good color, disk/cup ratio 0.4.  CHEST:  Lungs clear to percussion and auscultation.  HEART:  Normal sinus rhythm, no cardiomegaly, no murmurs.  ABDOMEN:  Negative.  EXTREMITIES:  Negative.  ADMISSION DIAGNOSES: 1. Senile nuclear cataract, both eyes. 2. Proliferative diabetic retinopathy, both eyes. 3. Insulin-dependent diabetes mellitus.  SURGICAL PLAN:  Cataract implant surgery - right eye now, left eye later as needed. Dictated by:   Guadelupe Sabin, M.D. Attending Physician:  Ivor Messier DD:  07/03/01 TD:  07/04/01 Job: [id number]

## 2010-07-06 NOTE — Op Note (Signed)
NAMEDAYMOND, CORDTS NO.:  [id number]   MEDICAL RECORD NO.:  [id number]                   PATIENT TYPE:  OIB   LOCATION:  2899                                 FACILITY:  [facility]   PHYSICIAN:  Guadelupe Sabin, M.D.             DATE OF BIRTH:  10-Jul-1932   DATE OF PROCEDURE:  08/20/2002  DATE OF DISCHARGE:                                 OPERATIVE REPORT   PREOPERATIVE DIAGNOSIS:  Chronic and recurrent vitreous hemorrhage, left  eye, secondary to proliferative diabetic retinopathy.   POSTOPERATIVE DIAGNOSIS:  Chronic and recurrent vitreous hemorrhage, left  eye, secondary to proliferative diabetic retinopathy.   OPERATIONS:  1. Pars plana vitrectomy using vitreous infusion-suction cutter.  2. Endolaser panretinal photocoagulation.  3. Preretinal membrane peeling and excision.   SURGEON:  Guadelupe Sabin, M.D.   ASSISTANT:  Nurse.   ANESTHESIA:  General.   OPHTHALMOSCOPY:  As previously drawn and described.   DESCRIPTION OF PROCEDURE:  After the patient was prepped and draped, a lid  speculum was inserted in the left eye.  The eye was turned downward and a  superior rectus traction suture placed.  A peritomy was performed adjacent  to the lumbus from the 9 to 4:30 position.  The subconjunctival tissue was  cleaned and three sclerotomy sites prepared at the 10, 2, and 4 o'clock  positions 3.5 mm from the limbus using the MVR blade 20 gauge type.  The 4  mm vitreous infusion terminal was secured in place at the 4 o'clock position  and held in place with a 5 mattress Dacron suture.  The tip could be seen  projecting into the vitreous cavity.  The fiberoptic light pipe was inserted  at the 2 o'clock position and the handpiece of vitreous infusion-suction  cutter at the 10 o'clock position.  Slow vitreous infusion suction cutting  were begun as observed through the operating microscope.  The vitreous was  quite fibrinous and inferiorly residual  vitreous hemorrhage was present.  As  the retina was approached, there appeared to be a small area of tractional  retinal detachment along the superior temporal retinal blood vessel.  The  peripheral attachments were severed and the stalk trimmed back to the  retinal surface.  There did not appear to be any retinal breaks present.  Other surface membranes were aspirated and peeled from the surface with the  handpiece of the vitreous infusion suction cutter.  The vitreous scissors  were not required.  There was some scleral depression to remove the  peripheral vitreous hemorrhage inferiorly.  It was then elected to assemble  the laser endophotocoagulator.  There were 767 applications made in  untreated areas.  The patient had already had considerable laser  photocoagulation.  The instruments were withdrawn temporarily and the fundus  inspected with indirect ophthalmoscopy and scleral depression.  No  peripheral retinal tears or detachment areas were noted.  It was then felt  that the surgical goals had been achieved and it was elected to close.  The  instruments were removed from the vitreous cavity and the sclerotomy sites  closed with radial 7-0 Vicryl sutures.  The conjunctiva was then pulled  forward and closed with a running 7-0 Vicryl suture.  Depo-Garamycin and  Celestone were injected in the subtenon's space inferiorly.  Maxitrol and  atropine ointment were instilled in the conjunctival cul-de-sac.  A light  touch and protective shield were applied to the operated left eye.  The  duration of the procedure was one-and-a-half hours.  The patient tolerated  the procedure well in general and left the operating room for the recovery  room and subsequently to the 23-hour observation unit.                                               Guadelupe Sabin, M.D.    HNJ/MEDQ  D:  08/20/2002  T:  08/20/2002  Job:  [id number]   cc:   Vincenza Hews, M.D.  [address]  [place]   [place] [address]  Fax: [phone]   Teena Irani. Arlyce Dice, M.D.  [address]  [place]  [place] [address]  Fax: [phone]   Vesta Mixer, M.D.  [address]  [place]  [place] [address]  Fax: [phone]

## 2010-07-06 NOTE — H&P (Signed)
Roy Kane, Roy Kane NO.:  [id number]   MEDICAL RECORD NO.:  [id number]                   PATIENT TYPE:  OIB   LOCATION:  2899                                 FACILITY:  [facility]   PHYSICIAN:  Guadelupe Sabin, M.D.             DATE OF BIRTH:  [birth year]   DATE OF ADMISSION:  11/12/2002  DATE OF DISCHARGE:  11/12/2002                                HISTORY & PHYSICAL   REASON FOR ADMISSION:  This was a planned outpatient readmission of this 77-  year-old white male admitted for cataract implant surgery of the left eye.   HISTORY OF PRESENT ILLNESS:  This patient has a long history of insulin  dependent diabetes and complications including  proliferative diabetic  retinopathy and cataract formation. The patient has undergone previous  vitrectomy surgery right eye May 02, 2000, right eye February 24, 2002, and  cataract implant surgery right eye Jul 02, 2001. The patient has tolerated  the procedures well in general, but the unoperated left eye has reduced  vision at 20/200 and the patient has elected to proceed with similar  cataract implant surgery.   PAST MEDICAL HISTORY:  The patient is stable general health under the care  of his regular physician, Dr. Elease Hashimoto. The patient appears to be in stable  general health for the proposed surgery under local anesthesia.   REVIEW OF SYSTEMS:  No cardiorespiratory complaints.   PHYSICAL EXAMINATION:  VITAL SIGNS:  As recorded on admission. Blood  pressure 159/91, heart rate 66, respirations 18, temperature 97.4.  GENERAL:  The patient is a pleasant, well developed, well nourished white  male in no acute distress.  HEENT:  Visual acuity with correction 20/60 right eye, 20/100 left eye,  applanation tonometry 12 mm right eye, 16 left eye. External ocular and slit  lamp examination, the eyes are wide and clear  with a clear cornea, deep and  clear  anterior chamber. A posterior  chamber intraocular lens  implant is  present in the right eye and nuclear cataract formation in the left eye.  Detailed fundus examination reveals a clear vitreous, attached retina with  extensive panretinal laser photocoagulation. The optic nerves are sharply  outlined without neovascularization. Disk cup ratio approximately 0.4. The  blood vessels reveal the old, inactive proliferative retinopathy in both  eyes. The macular area is clear.  CHEST:  Lungs clear to auscultation and percussion.  HEART:  Normal sinus rhythm, no cardiomegaly, no murmurs.  ABDOMEN:  Negative.  EXTREMITIES:  Negative.   ADMISSION DIAGNOSIS:  1. Nuclear cataract, left eye.  2. Advanced proliferative diabetic retinopathy both eyes.  3. Status post vitrectomy surgery both eyes.    PLAN:  Cataract implant surgery, left eye. The patient and his family have  been once again given oral discussion and printed information concerning the  procedure and its possible  complications. He has signed an informed consent  and arrangements are made for his outpatient admission at this time.                                                Guadelupe Sabin, M.D.    HNJ/MEDQ  D:  11/12/2002  T:  11/13/2002  Job:  [id number]   cc:   Alonna Minium, M.D.   Vesta Mixer, M.D.  [address]  [place]  [place] [address]  Fax: [phone]   Vincenza Hews, M.D.  [address]  [place]  [place] [address]  Fax: [phone]

## 2010-07-06 NOTE — Op Note (Signed)
[place]. [facility]  Patient:    Roy Kane, Roy Kane Visit Number: [id number] MRN: [id number]          Service Type: DSU Location: [facility] 2899 33 Attending Physician:  Ivor Messier Dictated by:   Guadelupe Sabin, M.D. Proc. Date: 07/02/01 Admit Date:  07/02/2001 Discharge Date: 07/02/2001   CC:         Dr. Alonna Minium  Dr. Vincenza Hews   Operative Report  PREOPERATIVE DIAGNOSES: 1. Nuclear cataract, right eye. 2. Proliferative diabetic retinopathy, right eye. 3. Status post posterior vitrectomy surgery right eye with endolaser    photocoagulation.  SURGEON:  Guadelupe Sabin, M.D.  ASSISTANT:  Nurse.  ANESTHESIA:  Local 4% Xylocaine, 0.75% Marcaine.  Anesthesia standby required. Patient given sodium pentothal intravenously during the period of retrobulbar injection.  OPERATIVE PROCEDURE:  After the patient was prepped and draped a lid speculum was inserted in the right eye.  The eye was turned downward and schiotz tonometry recorded at 5-6 scale units with a 5.5 g weight.  A peritomy was performed adjacent to the limbus from the 11 to 1 oclock position.  The corneoscleral junction was cleaned and a corneoscleral groove made with a 45-degree Superblade.  The anterior chamber was then entered with the 2.5 mm Diamond keratome at the 12 oclock position and the 15-degree blade at the 2:30 position.  Using a bent 26-gauge needle on a Healon syringe, a circular capsulorrhexis was begun and then completed with the Grabow forceps. Hydrodissection and hydrodelineation were performed using 1% Xylocaine.  The 30-degree phacoemulsification tip was then inserted with slow, controlled emulsification of the lens nucleus.  Total ultrasonic time - one minute 20 seconds; average power level - 20%; total amount of fluid used - 90 cc. Following removal of the nucleus, the residual cortex was aspirated with irrigation aspiration tip.  The posterior  capsule appeared intact with a brilliant red fundus reflex.  It was therefore elected to insert an EZE-60 polymethyl methacrylate lens, diopter power +20.50, lens size 6.00 mm.  The polymethyl methacrylate was felt advisable in this diabetic patient with history of vitreous surgery.  The incision was widened to 6 mm and using the Shepherd forceps, the implant was inserted in the anterior chamber and then centered into the capsular bag using the [facility] lens rotator.  The lens appeared to be well centered.  The incision was then sutured with three 10-0 interrupted nylon sutures at the 10 oclock position.  No sutures were required at the 2:30 position.  Maxitrol ointment was instilled in the conjunctival cul-de-sac and a light patch and protector shield applied. Duration of procedure and anesthesia administration - 45 minutes.  The patient tolerated the procedure well in general, left the operating room for the outpatient recovery room in good condition. Dictated by:   Guadelupe Sabin, M.D. Attending Physician:  Ivor Messier DD:  07/03/01 TD:  07/04/01 Job: [id number]

## 2010-07-06 NOTE — H&P (Signed)
[place]. [facility]  Patient:    Roy Kane, Roy Kane                        MRN: [id number] Adm. Date:  [id number] Attending:  Ivor Messier CC:         Vincenza Hews, M.D.  Teena Irani. Arlyce Dice, M.D.   History and Physical  REASON FOR ADMISSION:  This is an urgent outpatient admission of this 77 year old white male, admitted with advance proliferative diabetic retinopathy in both eyes, for vitreous surgery of the right eye at this time.  PRESENT ILLNESS:  This patient has a history of initially non-insulin-dependent diabetes and subsequent insulin-dependent diabetes, who has developed secondary complications of diabetic retinopathy.  The patient has been seen previously by Dr. August Luz at [facility] for [place].  He has undergone panretinal laser photocoagulation and focal photocoagulation of both eyes.  Recently, on moving into the [place] area, the patient, however, developed further decrease in vision and was seen by Dr. Vincenza Hews, [facility] ophthalmologist.  Dr. Emily Filbert noted the patients diabetic retinopathy and referred the patient to my office for further evaluation and/or treatment.  Initial examination in my office revealed a visual acuity of 20/60 in each eye without correction and 20/50-, right eye, 20/40, left eye, with correction.  A detailed fundus examination with biomicroscopy and indirect ophthalmoscopy revealed advanced proliferative diabetic retinopathy.  The right eye revealed slight vitreous and lens haze, vitreous deterioration and proliferative retinopathy with optic nerve neovascularization and a glassy appearance to the macula.  Peripheral panretinal laser scars could be seen with gap areas.  Isolated areas of proliferation were noted at selected sites.  The left eye revealed similar findings of a slight vitreous haze, retinal attachment, a slightly pale optic nerve in the left eye, disk-cup ratio 0.3  and pre-proliferative and proliferative retinopathy of the left eye.  It was felt that the patient warranted additional panretinal laser photocoagulation.  The patient returned to the office for this laser photocoagulation on two occasions for the left eye and one session in the right eye; despite this surgery, however, his proliferative retinopathy has continued.  When he returned to the office on April 30, 2000, advanced proliferation was noted at the optic nerve extending into the vitreous cavity with selected areas of retinal traction.  It was felt that the patient warranted vitreous surgery and arrangements were made in this case of rapidly advancing proliferative retinopathy.  Patient was given oral discussion and printed information concerning the operative procedure and its complications; patient was also offered consultation at one of the local medical schools.  The patient, however, elected to proceed with surgery in [place].  Arrangements were made for his outpatient admission at this time.  PAST MEDICAL HISTORY:  Patient is under the care of Dr. Teena Irani. Arlyce Dice.  Dr. Arlyce Dice, in a telephone conversation, feels the patient is in satisfactory condition for general anesthesia and the proposed surgery.  REVIEW OF SYSTEMS:  No current cardiorespiratory complaints.  CURRENT MEDICATIONS:  Forty units in the morning and 40 in the afternoon of 70/30 insulin.  Additional medications include ______ , Lipitor, furosemide, Diovan, ______, Toprol.  He had previously been taking no eye medications.  PHYSICAL EXAMINATION:  VITAL SIGNS:  Vital signs as recorded on admission:  Temperature 98.4, pulse 63, respirations 16, blood pressure 179/92.  GENERAL APPEARANCE:  Patient is a pleasant, well-nourished, well-developed white male with deteriorating vision.  HEENT:  Visual acuity as noted above -- visual acuity 20/200, right eye; 20/70-, left eye.  With refraction, 20/70, right eye;  20/60, left eye. Slitlamp examination:  Slight lens nuclear sclerosis.  Fundus examination: Marked proliferative retinopathy, greater in the right than left eye.  CHEST:  Lungs clear to percussion and auscultation.  HEART:  Normal sinus rhythm.  No cardiomegaly.  No murmurs.  ABDOMEN:  Negative.  EXTREMITIES:  Negative.  ADMISSION DIAGNOSES: 1. Advanced proliferative diabetic retinopathy, both eyes. 2. Diabetes mellitus, insulin-dependent type.  SURGICAL PLAN:  Posterior vitrectomy with additional endolaser photocoagulation and membrane peeling and excision, right eye. DD:  05/02/00 TD:  05/02/00 Job: [id number]

## 2010-07-06 NOTE — Op Note (Signed)
[place]  Patient:    Roy Kane, Roy Kane                        MRN: [id number] Proc. Date: 05/02/00 Adm. Date:  [id number] Attending:  Ivor Messier CC:         Vincenza Hews, M.D.  Teena Irani. Arlyce Dice, M.D.   Operative Report  PREOPERATIVE DIAGNOSES:  Advanced proliferative diabetic retinopathy; diabetes mellitus, insulin-dependent type.  POSTOPERATIVE DIAGNOSES:  Advanced proliferative diabetic retinopathy; diabetes mellitus, insulin-dependent type.  OPERATION:  Posterior vitrectomy through pars plana using vitreous infusion suction with membrane peeling and excision and endolaser photocoagulation.  SURGEON:  Guadelupe Sabin, M.D.  ASSISTANT:  Nurse.  ANESTHESIA:  General.  OPHTHALMOSCOPY:  As previously described.  OPERATIVE PROCEDURE:  After the patient was prepped and draped, a lid speculum was inserted in the right eye.  The eye was turned downward and a superior rectus traction suture placed.  A peritomy was performed adjacent to the limbus from the 8 to 2:30 position.  The subconjunctival tissue was cleaned and three sclerotomy sites prepared 3.5 mm from the limbus at the 10, 2 and 8:30 positions.  The vitreous infusion terminal, 4-mm length, was inserted in the sclerotomy site at the 8:30 position and held in place with a 5-0 Dacron suture.  The fiberoptic lightpipe was inserted at the 2 oclock position and the handpiece of the vitreous infusion suction-cutter at the 10 oclock position.  Slow vitreous infusion and suction-cutting were begun from an anterior-to-posterior direction.  Marked vitreous membranes were present from the posterior retina to the vitreous base.  These glassy membranes were perforated and then excised from their anterior attachment.  Marked proliferation was noted at the optic nerve extending into the vitreous cavity. Other proliferative sites were noted with small areas of tractional  retinal detachment, especially at the 10:30 position.  As the retina was approached, these attachments were again severed.  There was transient bleeding which stopped on raising the intraocular pressure.  Attention was paid to the membranes at the optic nerve, which were excised and peeled with the Michels pick.  There did not appear to be any additional flat or anterior or posterior direction membranes.  The endolaser photocoagulator was then assembled and approximately 650 additional applications applied in a scatter technique; good retinal burns were achieved.  The peripheral fundus was inspected with indirect ophthalmoscopy and no areas of retinal tearing or detachment noted. It was then elected to close.  The vitreous instruments were withdrawn and the sclerotomy sites closed with 7-0 interrupted Vicryl sutures.  Conjunctiva was then pulled forward and closed with a running 7-0 Vicryl suture. Depo-Garamycin and Depo-dexamethasone were injected in the sub-tenon space inferiorly.  Maxitrol and Atropine ointment were then instilled in the conjunctival cul-de-sac and a light patch and protector shield applied. Duration of procedure:  One to one and one-half hours.  Patient tolerated the procedure well in general and left the operating room for the recovery room and subsequently to the 23-hour observation unit. DD:  05/02/00 TD:  05/02/00 Job: [id number]

## 2010-07-06 NOTE — Op Note (Signed)
Roy Kane, BLOMQUIST NO.:  [id number]   MEDICAL RECORD NO.:  [id number]                   PATIENT TYPE:  OIB   LOCATION:  6705                                 FACILITY:  [facility]   PHYSICIAN:  Gita Kudo, M.D.              DATE OF BIRTH:  06-Oct-1932   DATE OF PROCEDURE:  03/11/2003  DATE OF DISCHARGE:  03/12/2003                                 OPERATIVE REPORT   PREOPERATIVE DIAGNOSIS:  Gallstones.   POSTOPERATIVE DIAGNOSIS:  Gallstones. Normal appearing common duct and  hepatic biliary tree.   PROCEDURE:  Laparoscopic cholecystectomy with intraoperative cholangiogram.   SURGEON:  Gita Kudo, M.D.   ASSISTANT:  Leonie Man, M.D.   ANESTHESIA:  General endotracheal anesthesia.   INDICATIONS FOR PROCEDURE:  The patient is a 77 year old male with multiple  medical problems including  obesity, chronic  pulmonary  disease, heart  disease and diabetes, who had been maintained on anticoagulants and had a  bout of abdominal pain. He is on no anticoagulant now. He comes in for  elective surgery, having had surgery postponed recently because of a severe  URI.   FINDINGS:  The patient was quite obese and had a small, contracted, almost  intrahepatic gallbladder. Exposure was somewhat difficult but very adequate,  and cholangiogram  looked OK.   DESCRIPTION OF PROCEDURE:  Under satisfactory general endotracheal  anesthesia, having received 1.0 gm of Ancef preoperatively, the patient's  abdomen  was prepped and draped and positioned in the proper fashion. Then  0.5% Marcaine was infiltrated at all skin incisions for postoperative  analgesia.   A transverse incision was made above the umbilicus  and carried down to the  midline which was opened into the peritoneum. Controlled with a figure-of-8  0 Vicryl suture and an operating Hassan port inserted, secured, and good CO2  pneumoperitoneum was established. Two #5 ports were placed laterally  and a  2nd #10 medially.   Operating through the medial port, with good exposure and direct  visualization, the gallbladder  was grasped with the lateral graspers and  dissection begun by taking adhesions from the gallbladder  down carefully.  The cystic duct was then identified  and circumferentially dissected. It was  clipped close to the gallbladder and incised. A percutaneous catheter was  placed into the duct and cholangiograms were taken which looked good without  obstruction  or defect.   The catheter was withdrawn and duct controlled with multiple clips and  divided. Likewise the artery was circumferentially dissected, secured with  clips and divided. The gallbladder was then removed  from  below upward from  the liver using coagulating spatula for dissection and hemostasis. After the  gallbladder was removed, the liver bed was lavaged with saline and made dry  by cautery.   Then because of a small hole in the gallbladder, it was placed in an  EndoCatch bag and  the camera moved to the upper port, and through the  umbilical port, a large grasper was used to extract the gallbladder  in the  bag intact without spillage or complication. The operative site was again  inspected and noted to be hemostatic, lavaged with saline and suctioned  dry. There was no bile or blood.   The ports and CO2 were released. Then the midline was closed with a previous  figure-of-8 as well as a 2nd interrupted 0 Vicryl figure-of-8 suture. The  subcu was approximated with 4-0 Vicryl and Steri-Strips were used to  approximate the skin.   The patient tolerated the procedure well. He went to the recovery room from  the operating room in good condition without complications.                                               Gita Kudo, M.D.    MRL/MEDQ  D:  03/11/2003  T:  03/12/2003  Job:  [id number]   cc:   Teena Irani. Arlyce Dice, M.D.  [address]  [place]  [place] [address]  Fax: [phone]

## 2010-07-06 NOTE — H&P (Signed)
NAMEJAGGAR, BENKO NO.:  [id number]   MEDICAL RECORD NO.:  [id number]           PATIENT TYPE:   LOCATION:                                 FACILITY:   PHYSICIAN:  Vesta Mixer, M.D. DATE OF BIRTH:  02-Dec-1932   DATE OF ADMISSION:  DATE OF DISCHARGE:                                HISTORY & PHYSICAL   Roy Kane is a middle-aged gentleman with a history of obesity,  diastolic dysfunction, and dyspnea.  He presents with a recent onset of  chest pressure with radiation to the left arm.  He is referred for heart  catheterization for further evaluation.   Roy Kane is a middle-aged gentleman who has been seen for the past four years  for shortness of breath, obesity, insulin-dependent diabetes mellitus, and  some generalized fatigue.  He has lost quite a bit of weight over the past  couple years and made some dramatic improvements.  He has not had any  episodes of chest pain until recently.   In the past one or two months he has had episodes of shortness of breath  with chest discomfort.  These episodes typically wake him up at night.  They  seem to radiate out his left arm.  They last for 45 minutes and resolve  spontaneously.  He has not been able to do any exercise because of this  chest discomfort.  He has also had considerable shortness of breath.  Dr.  Arlyce Dice has tried him on an antibiotic for several weeks which did not help.  He had a negative stress Cardiolite study approximately two and a half years  ago.  He is now referred for heart catheterization given his symptoms.   CURRENT MEDICATIONS:  1.  Lipitor 40 mg a day.  2.  Lasix 40 mg a day.  3.  Diovan 320 mg a day.  4.  Cardura 4 mg a day.  5.  Toprol XL 50 mg a day.  6.  Novolin 70/30 30 units in the morning, but 25 units in the evening.   ALLERGIES:  None.   PAST MEDICAL HISTORY:  1.  Hyperlipidemia.  2.  Hypertension.  3.  Diabetes mellitus.  4.  Obesity.   FAMILY HISTORY:   Essentially negative.   REVIEW OF SYSTEMS:  Reviewed and is essentially negative.   PHYSICAL EXAMINATION:  GENERAL:  He is a middle-aged gentleman in no acute  distress.  He is alert and oriented x3 and his mood and affect are normal.  VITAL SIGNS:  His weight is up 9 pounds to 253.  Blood pressure is 150/80.  HEENT:  2+ carotids.  He has no bruits.  No JVD.  No thyromegaly.  LUNGS:  Clear to auscultation.  HEART:  Regular rate.  S1, S2.  He has a soft systolic murmur.  ABDOMEN:  Good bowel sounds, nontender.  EXTREMITIES:  No clubbing, cyanosis, edema.  NEUROLOGIC:  Nonfocal.   Mr. Fieldhouse presents with symptoms that are disturbing for unstable angina.  He is having episodes of chest pain with radiation to the left arm.  These  episodes of chest pain are associated with shortness of breath.  He has had  a negative stress Cardiolite study but at this point I do not think that  repeating the Cardiolite study is warranted.  He is still having concerning  symptoms and if he were to even have a negative stress Cardiolite study I  would tend to believe his symptoms instead of the Cardiolite study.  We have  referred him for cardiac catheterization.  We have discussed the risks,  benefits, and options concerning heart catheterization.  He understands and  agrees to proceed.  All of his other medical problems are stable.           ______________________________  Vesta Mixer, M.D.     PJN/MEDQ  D:  12/17/2004  T:  12/18/2004  Job:  [id number]   cc:   Teena Irani. Arlyce Dice, M.D.  Fax: [phone]

## 2010-07-06 NOTE — Op Note (Signed)
NAMEMELDRICK, BUTTERY NO.:  [id number]   MEDICAL RECORD NO.:  [id number]                   PATIENT TYPE:  OIB   LOCATION:  2899                                 FACILITY:  [facility]   PHYSICIAN:  Guadelupe Sabin, M.D.             DATE OF BIRTH:  11-26-1932   DATE OF PROCEDURE:  11/12/2002  DATE OF DISCHARGE:  11/12/2002                                 OPERATIVE REPORT   PREOPERATIVE DIAGNOSIS:  Nuclear cataract, left eye, status post pars plana  vitrectomy for diabetic proliferative retinopathy and vitreous hemorrhage,  left eye.   POSTOPERATIVE DIAGNOSIS:  Nuclear cataract, left eye, status post pars plana  vitrectomy for diabetic proliferative retinopathy and vitreous hemorrhage,  left eye.   PROCEDURE:  Planned extracapsular cataract extraction, phacoemulsification,  primary insertion of posterior chamber intraocular lens implant.   SURGEON:  Guadelupe Sabin, M.D.   ASSISTANT:  Nurse.   ANESTHESIA:  Local 4% Xylocaine, 0.75% Marcaine.  Anesthesia standby  required.  The patient was given sodium penthothal or Ditropan intravenously  during the period of retrobulbar blocking.   DESCRIPTION OF PROCEDURE:  After the patient was prepped and draped, a lid  speculum was inserted in the left eye.  The eye was turned downward and a  superior rectus traction suture placed.  Schiotz tonometry was recorded at 7  to 8 scale units with a 5.5 g weight.  A peritomy was performed adjacent to  the limbus from the 11 o'clock to 1 o'clock position.  The corneoscleral  junction was cleaned and a corneoscleral groove made with a 45 degree  Superblade.  The anterior chamber was then entered with a 2.5 mm diamond  Keratome at the 12 o'clock position and the 15 degree blade at the 2:30  position.  Using a bent 26 gauge needle on an Occucoat syringe, a circular  capsular rectus was begun and then completed with the Grabow forceps.  Hydrodissection and  hydrodelineation were performed using 1% Xylocaine. The  30 degree phacoemulsification tip was then inserted with slow  phacoemulsification of the lens nucleus with backcracking with the [facility]  pick. Total ultrasonic time 49 seconds, average power level 18%, total  amount of fluid used 70 mL.  Following removal of the nucleus the residual  cortex was aspirated with the irrigation aspiration tip.  The posterior  capsule appeared intact with a brilliant red fundus reflex.  It was  therefore elected to insert an Allergan medical optic [id number] silicone three  piece posterior chamber lens implant.  Diopter strength +20.50.  This was  inserted with the McDonald forceps into the anterior chamber with the  McDonald forceps, opened and then centered into the capsular bag using the  [facility] lens rotator.  The lens appeared to be well centered.  The Healon,  which had been used during the procedure, was then aspirated and replaced  with balanced salt solution and  Miochol ophthalmic solution.  The operative  incision appeared to be self sealing.  The 12 o'clock position, however, was  closed with a single 10-0 interrupted nylon suture to prevent  endophthalmitis.  Maxitrol ointment was instilled in the conjunctival cul-de-  sac, and a light patch and protector shield applied. Duration of procedure  and anesthesia administration, 45 minutes.  The patient tolerated the  procedure well in general and left the operating room for the recovery room  in good condition.                                               Guadelupe Sabin, M.D.    HNJ/MEDQ  D:  12/19/2002  T:  12/19/2002  Job:  [id number]   cc:   Vincenza Hews, M.D.  [address]  [place]  [place] [address]  Fax: [phone]

## 2010-07-06 NOTE — Cardiovascular Report (Signed)
NAME:  Roy Kane, Roy Kane NO.:  [id number]   MEDICAL RECORD NO.:  [id number]          PATIENT TYPE:  OIB   LOCATION:  NA                           FACILITY:  [facility]   PHYSICIAN:  Vesta Mixer, M.D. DATE OF BIRTH:  [birth year]   DATE OF PROCEDURE:  12/28/2004  DATE OF DISCHARGE:                              CARDIAC CATHETERIZATION   INDICATION:  Roy Kane is a 77 year old gentleman with a history of  hypertension and obesity.  He recently presented with some episodes of chest  discomfort.  He is referred for heart catheterization for further  evaluation.   APPROACH:  The right femoral artery was easily cannulated using modified  Seldinger technique.   HEMODYNAMIC RESULTS:  LV pressure was 190/23 with an aortic pressure 198/94.   ANGIOGRAPHY:  Left main:  The left main is large and normal.   The left anterior descending artery is normal.  There are 2 moderate-sized  diagonal branches, both of which are normal.  The mid and distal part of the  LAD are normal.   The left circumflex artery is a normal vessel.  The first obtuse marginal  artery is normal.  The posterolateral branch is normal.   The right coronary artery is a large and dominant.  It is normal throughout  its course.  The posterior descending artery and posterolateral segment  artery are normal.   LEFT VENTRICULOGRAM:  The left ventriculogram was performed in the 30 RAO  position.  It reveals a hyperdynamic left ventricle.  The ejection fraction  is around 80%.  There is no mitral regurgitation.  There is significant left  ventricular hypertrophy.   COMPLICATIONS:  None.   CONCLUSION:  1.  Fairly smooth and normal coronary arteries.  2.  Hyperdynamic left ventricular function.  I suspect that his symptoms are      due to diastolic dysfunction and his hypertrophic cardiomyopathy.  We      will continue with aggressive medical therapy.           ______________________________  Vesta Mixer, M.D.     PJN/MEDQ  D:  12/28/2004  T:  12/29/2004  Job:  [id number]   cc:   Teena Irani. Arlyce Dice, M.D.  Fax: [phone]

## 2010-07-27 ENCOUNTER — Encounter: Payer: Medicare Other | Admitting: *Deleted

## 2010-08-23 IMAGING — CR DG FOREARM 2V*R*
2 series · 2 of 2 positions shown · non-contrast
Comparison: None

CLINICAL DATA: Fell from ladder.  Pain.

RIGHT FOREARM - 2 VIEW

[x forearm ap right]
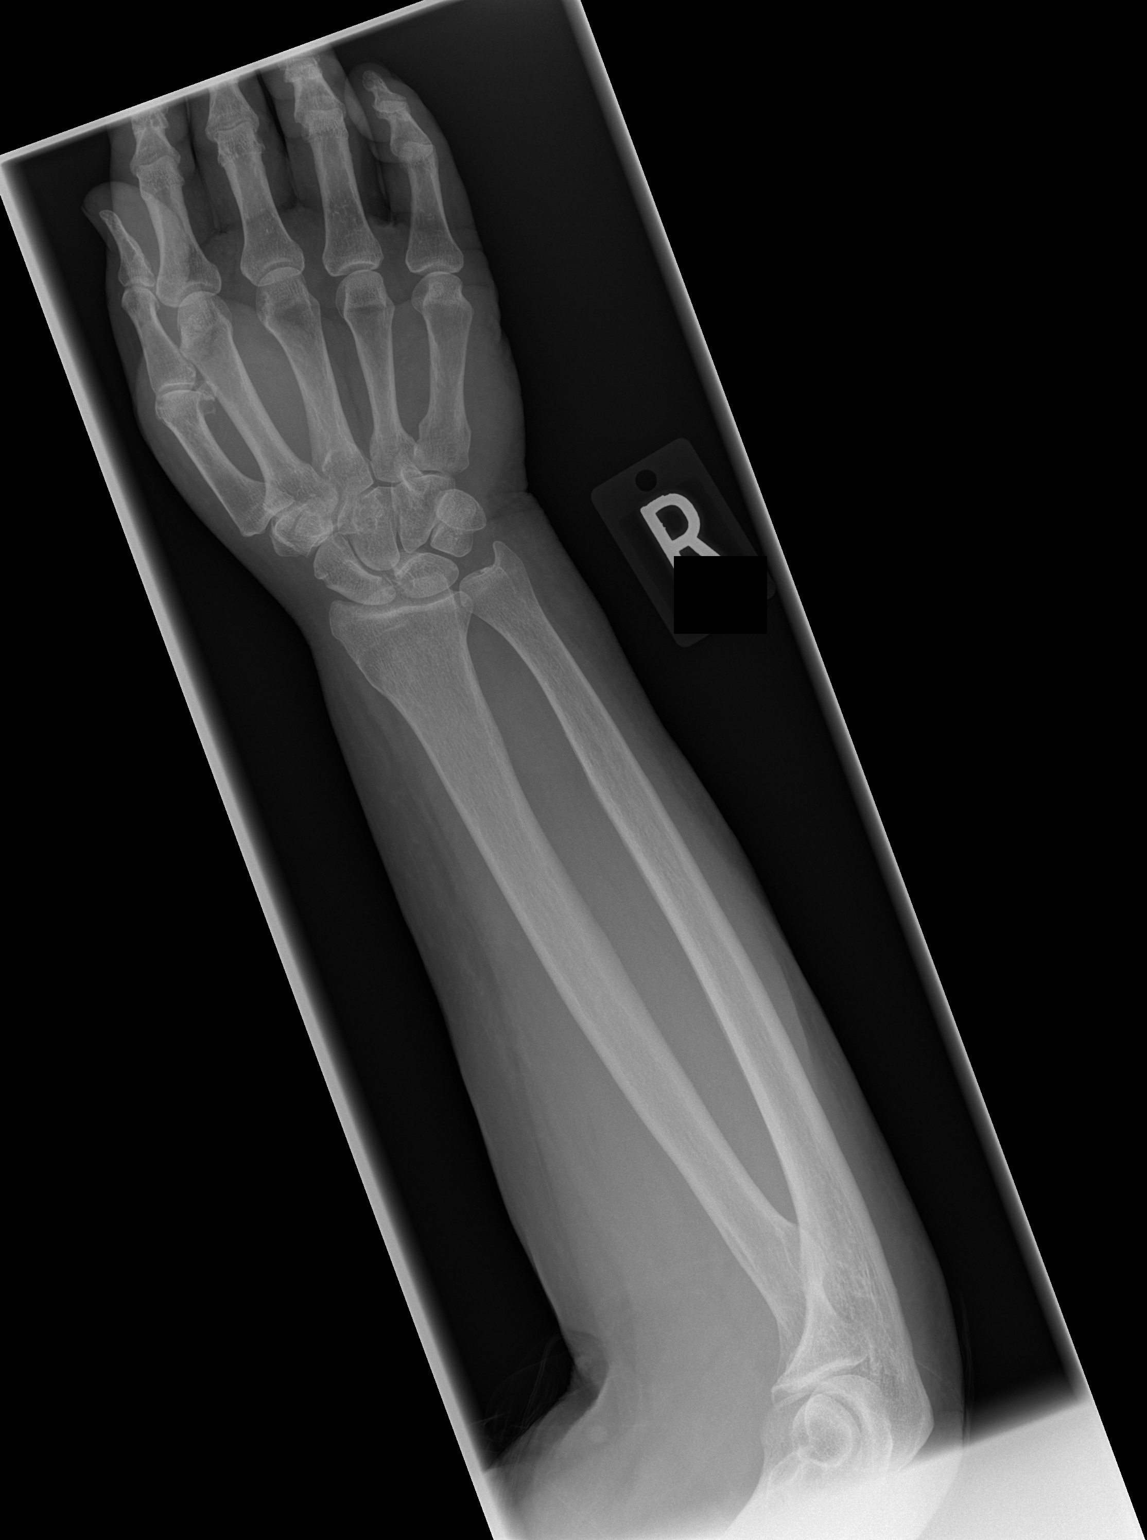

[x forearm lat right]
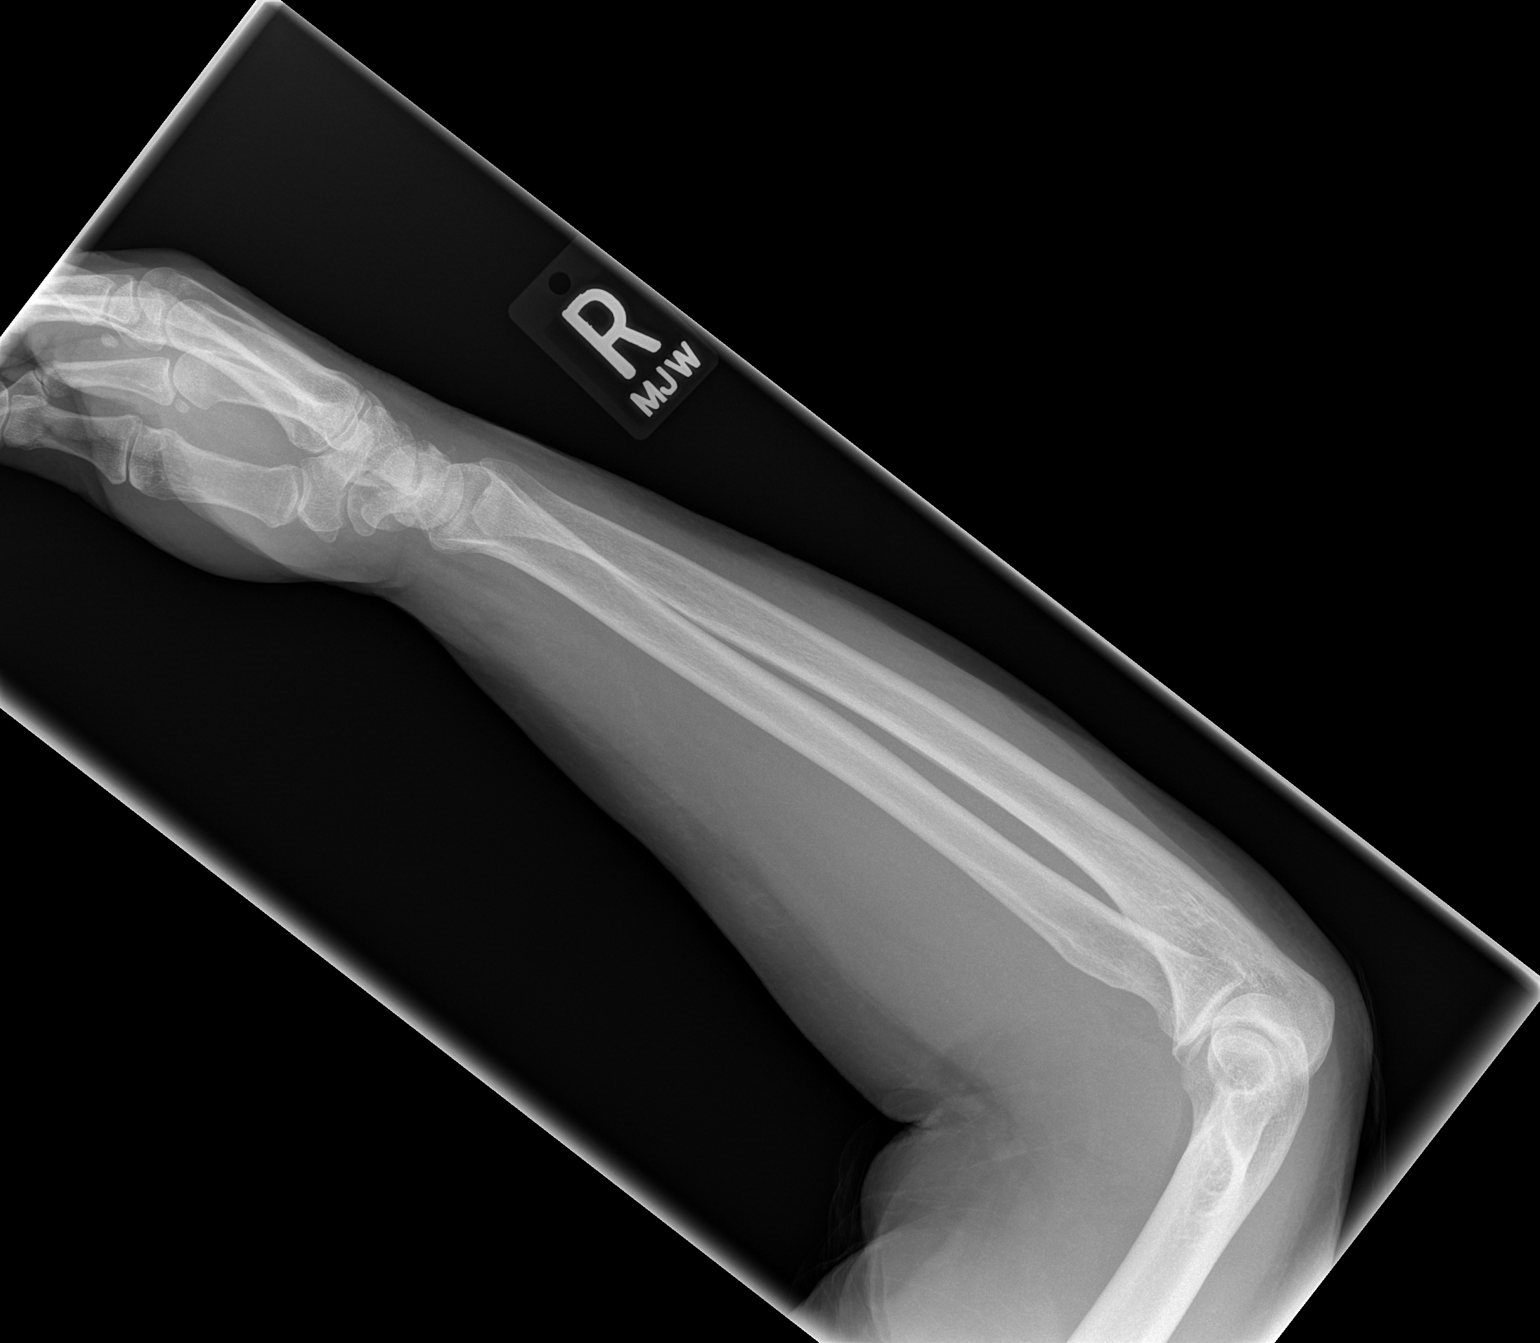

[2 of 2 positions shown; findings below may reference images not displayed]

FINDINGS: No fracture or bony displacement.
IMPRESSION: Negative.

## 2010-08-23 IMAGING — CR DG TIBIA/FIBULA 2V*L*
4 series · 4 of 4 positions shown · non-contrast
Comparison: None

CLINICAL DATA: Fell from ladder.

LEFT TIBIA AND FIBULA - 2 VIEW

[t tib/fib ap left (1 of 2)]
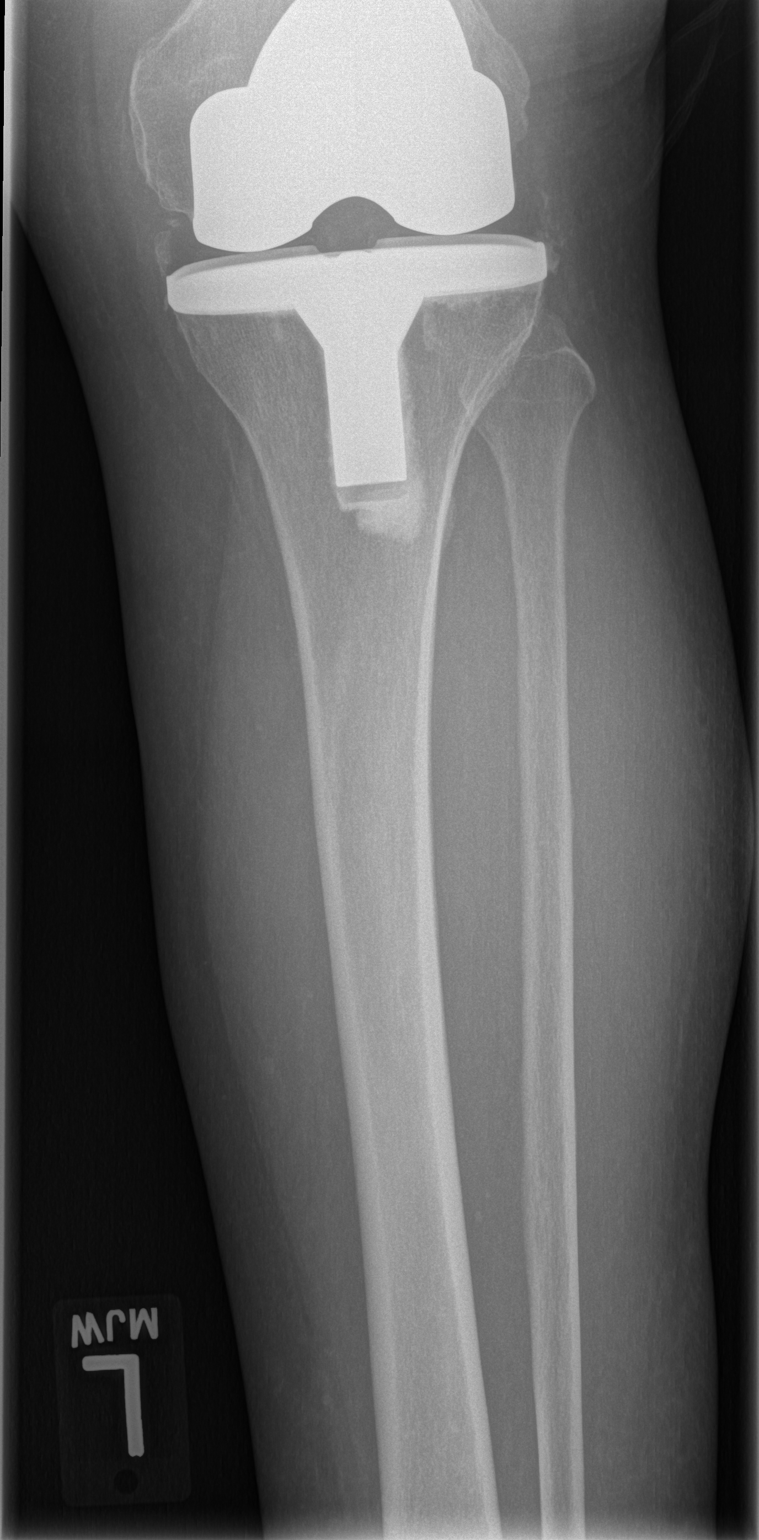

[t tib/fib ap left (2 of 2)]
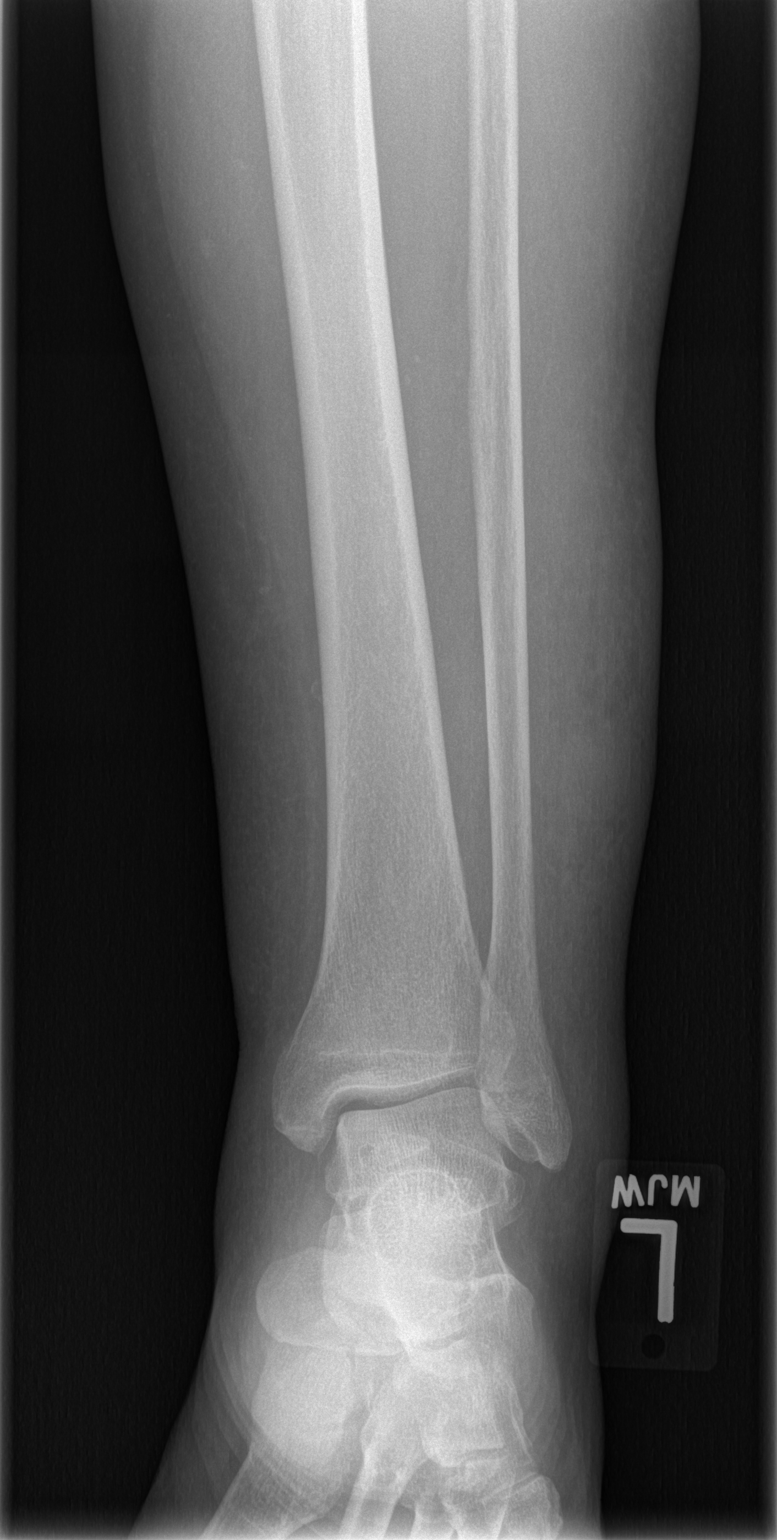

[t tib/fib lat left (1 of 2)]
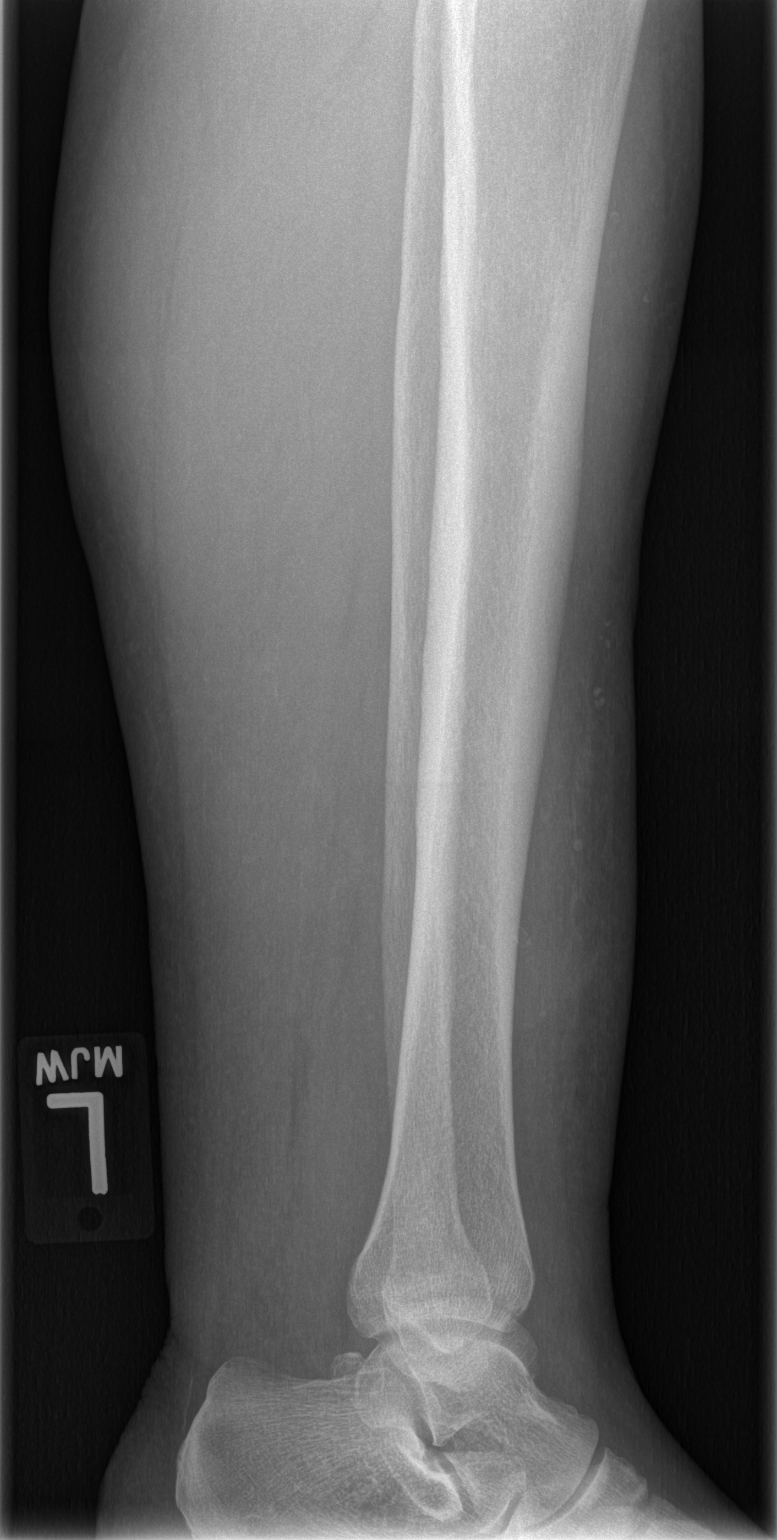

[t tib/fib lat left (2 of 2)]
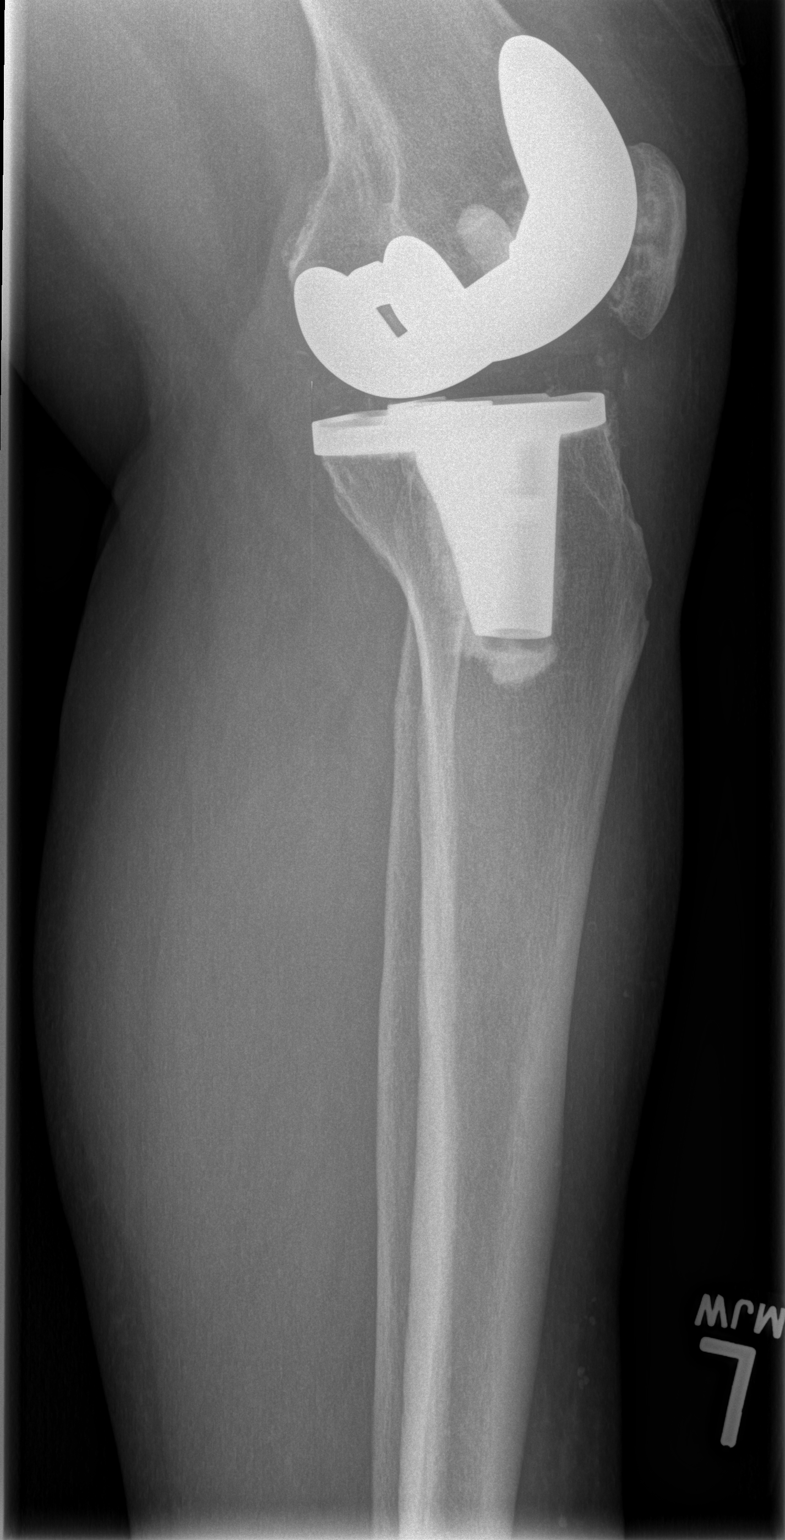

[4 of 4 positions shown; findings below may reference images not displayed]

FINDINGS: Intact right total knee replacement prosthetic
components.  No fracture or bony displacement.  Soft tissue
swelling of the ankle.
IMPRESSION: No acute osseous findings.

## 2010-08-23 IMAGING — CR DG SHOULDER 2+V PORT*R*
3 series · 3 of 3 positions shown · non-contrast
Comparison: Plain films same day

CLINICAL DATA: Post reduction of right shoulder dislocation

PORTABLE RIGHT SHOULDER - 2+ VIEW

[AP (1 of 3)]
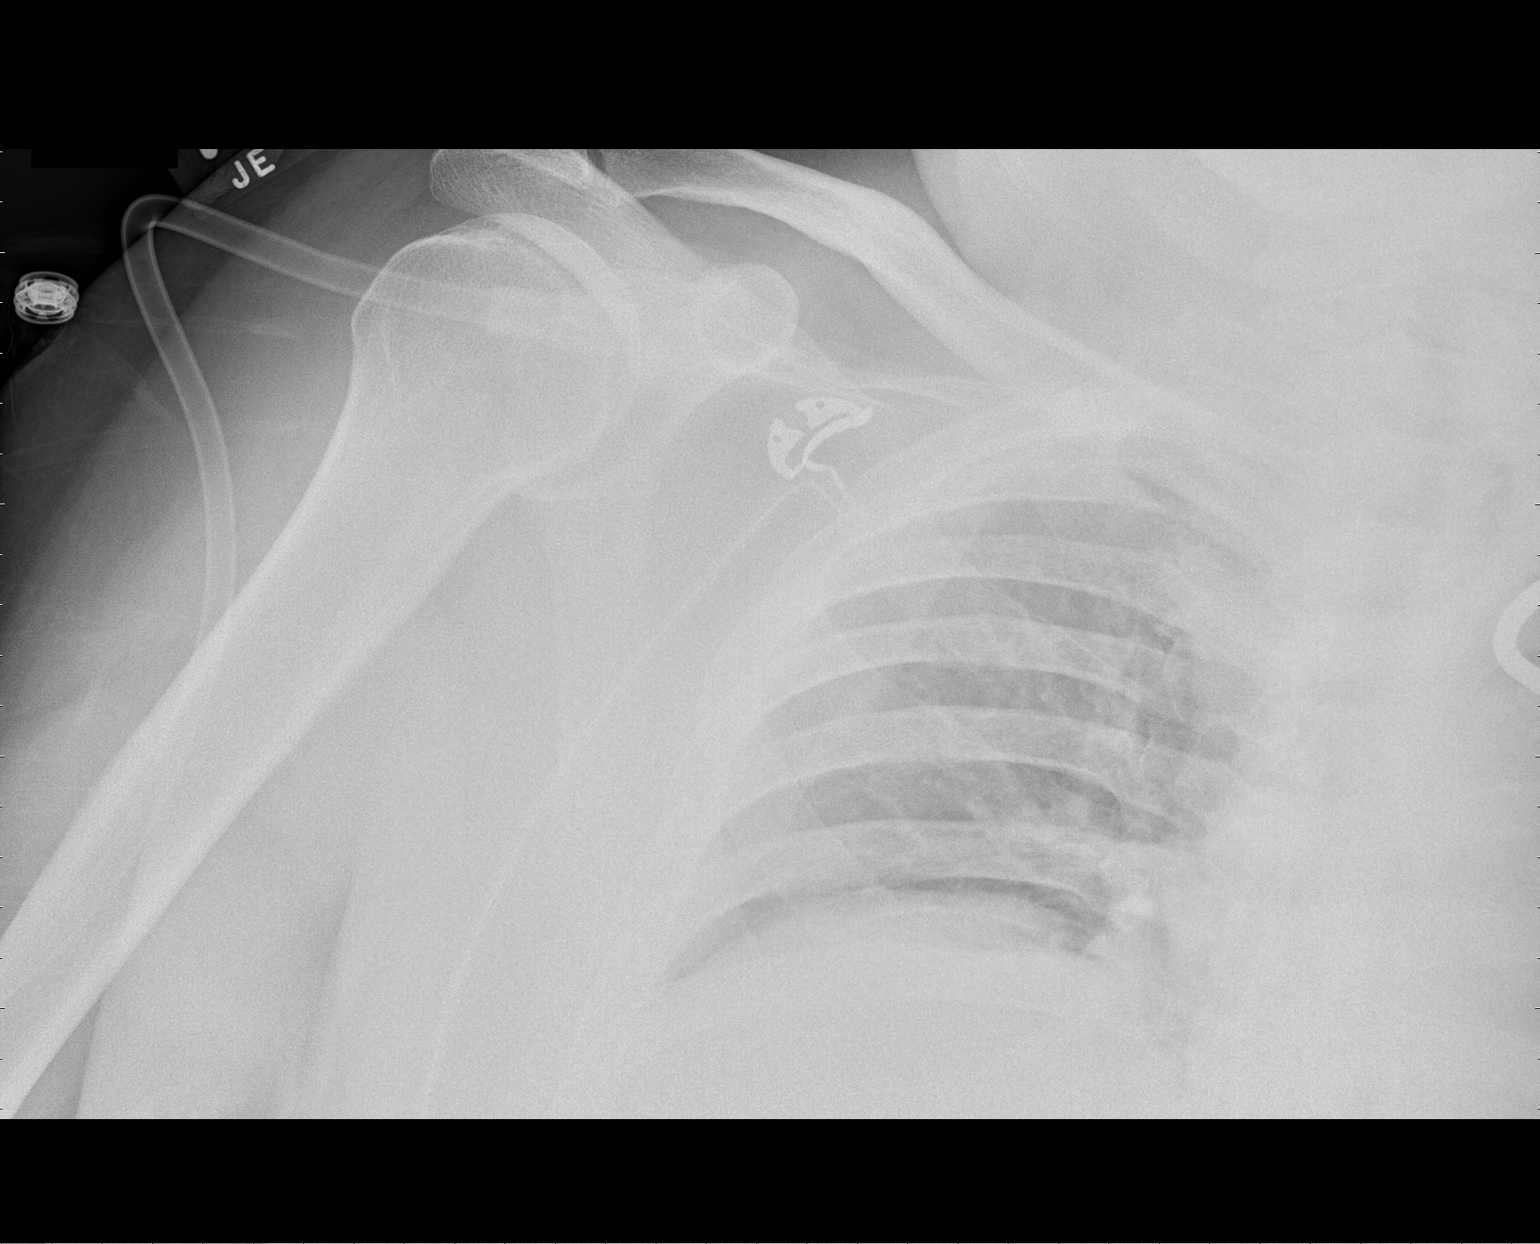

[AP (2 of 3)]
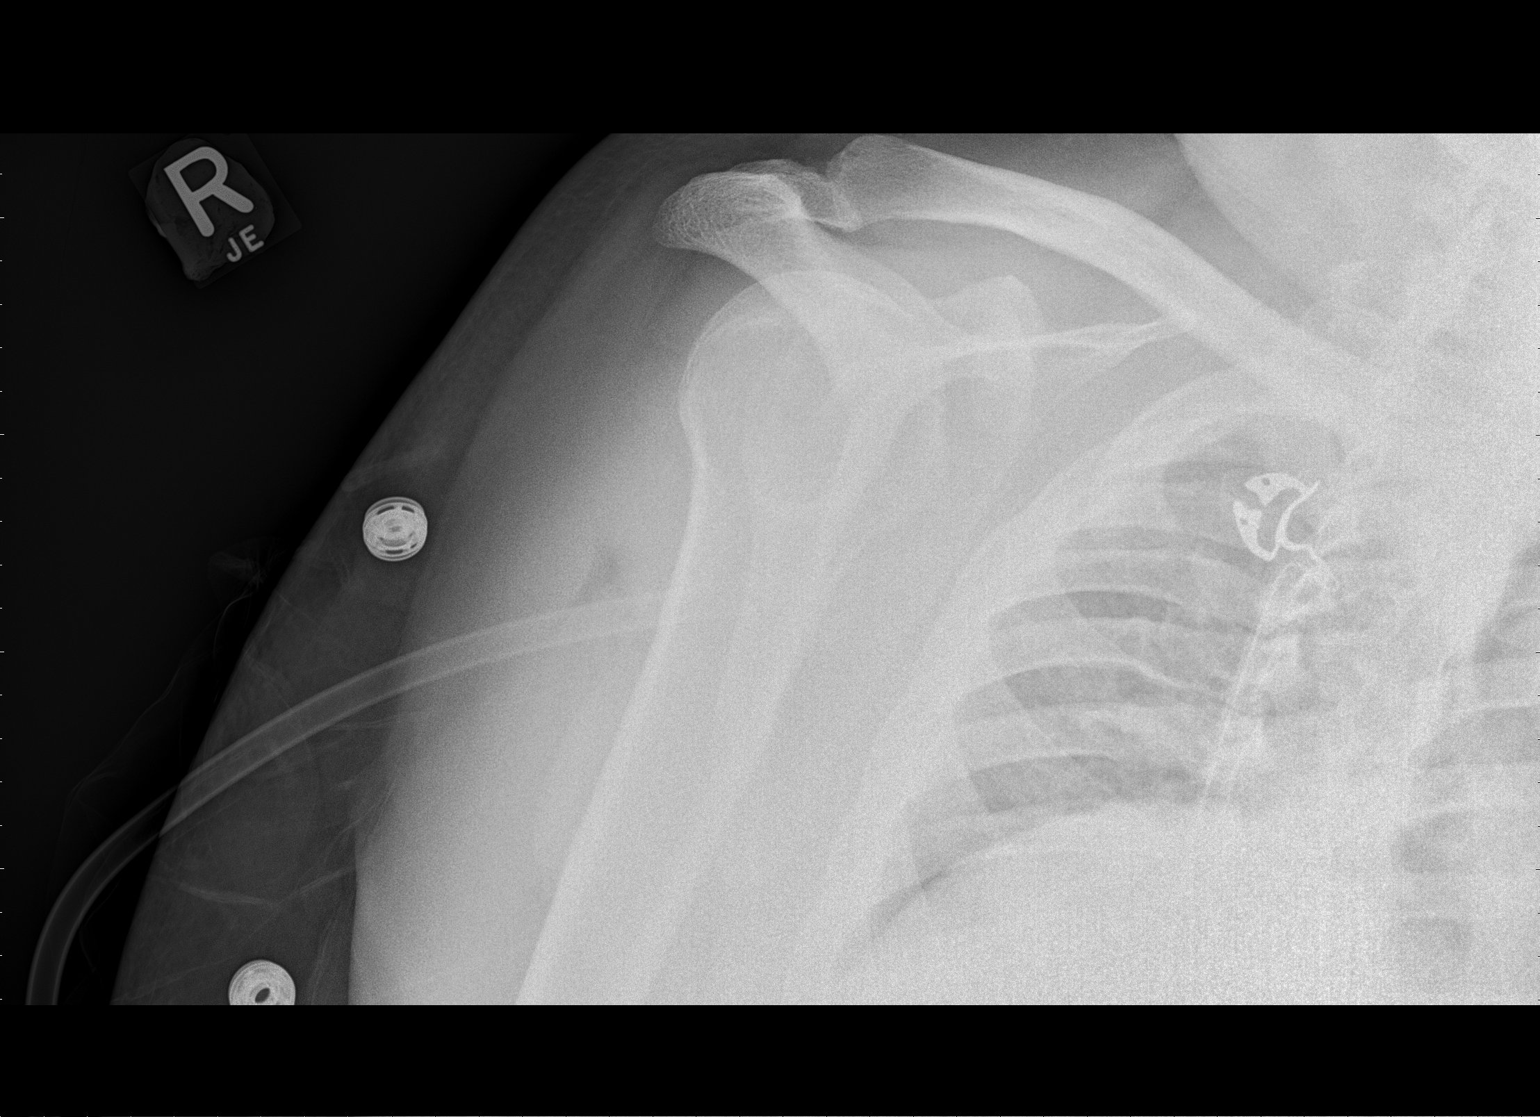

[AP (3 of 3)]
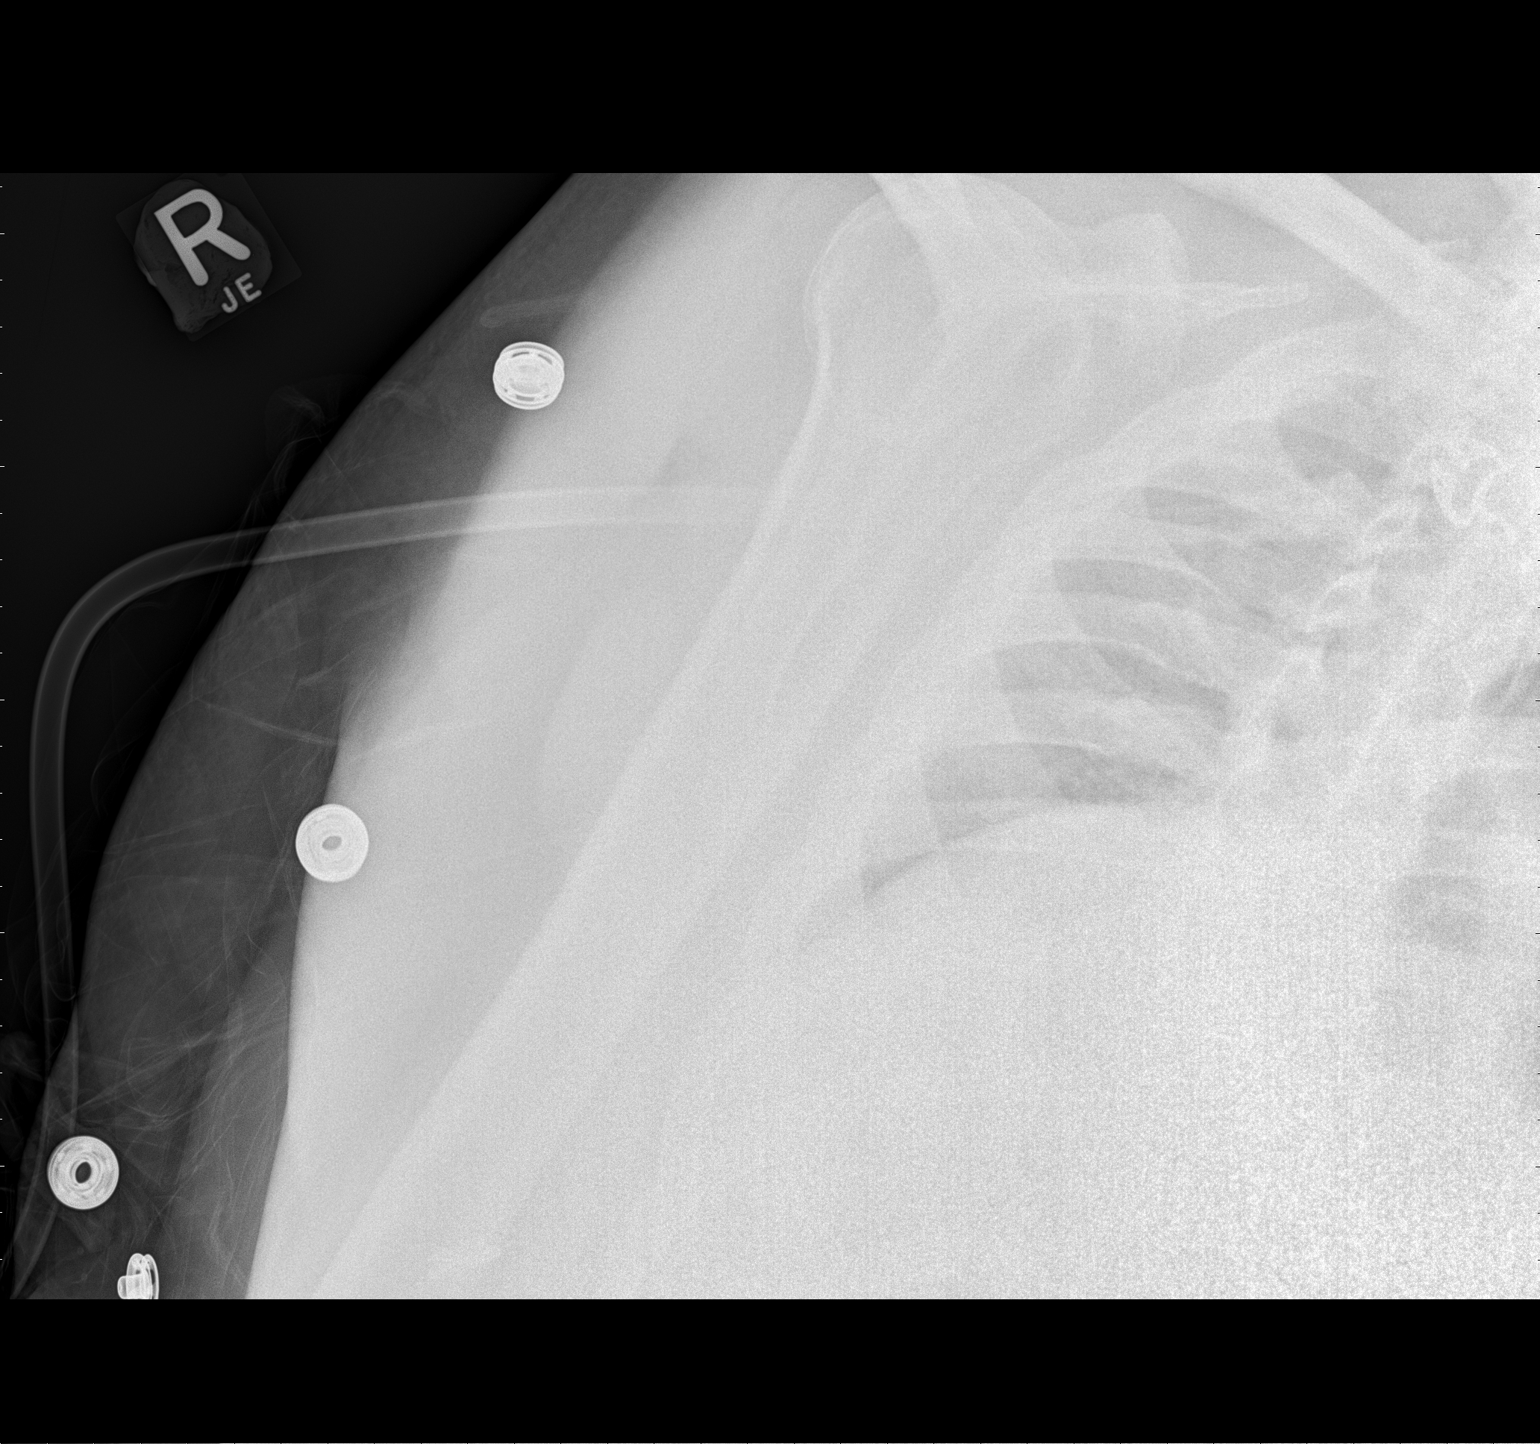

[3 of 3 positions shown; findings below may reference images not displayed]

FINDINGS: There is interval relocation of the humeral head into the
glenoid fossa.  The Bankart lesion is obscured on these views.  No
evidence of humeral head fracture.
IMPRESSION: Reduction of right shoulder dislocation.

## 2010-08-31 ENCOUNTER — Encounter: Payer: Self-pay | Admitting: Cardiovascular Disease

## 2010-09-07 IMAGING — CR DG CHEST 1V
1 series · 1 of 1 positions shown · non-contrast
Comparison: Report from 08/19/2002

CLINICAL DATA: Right shoulder rotator cuff tear.  Hypertension.
Shortness of breath.

CHEST - 1 VIEW

[view not recorded]
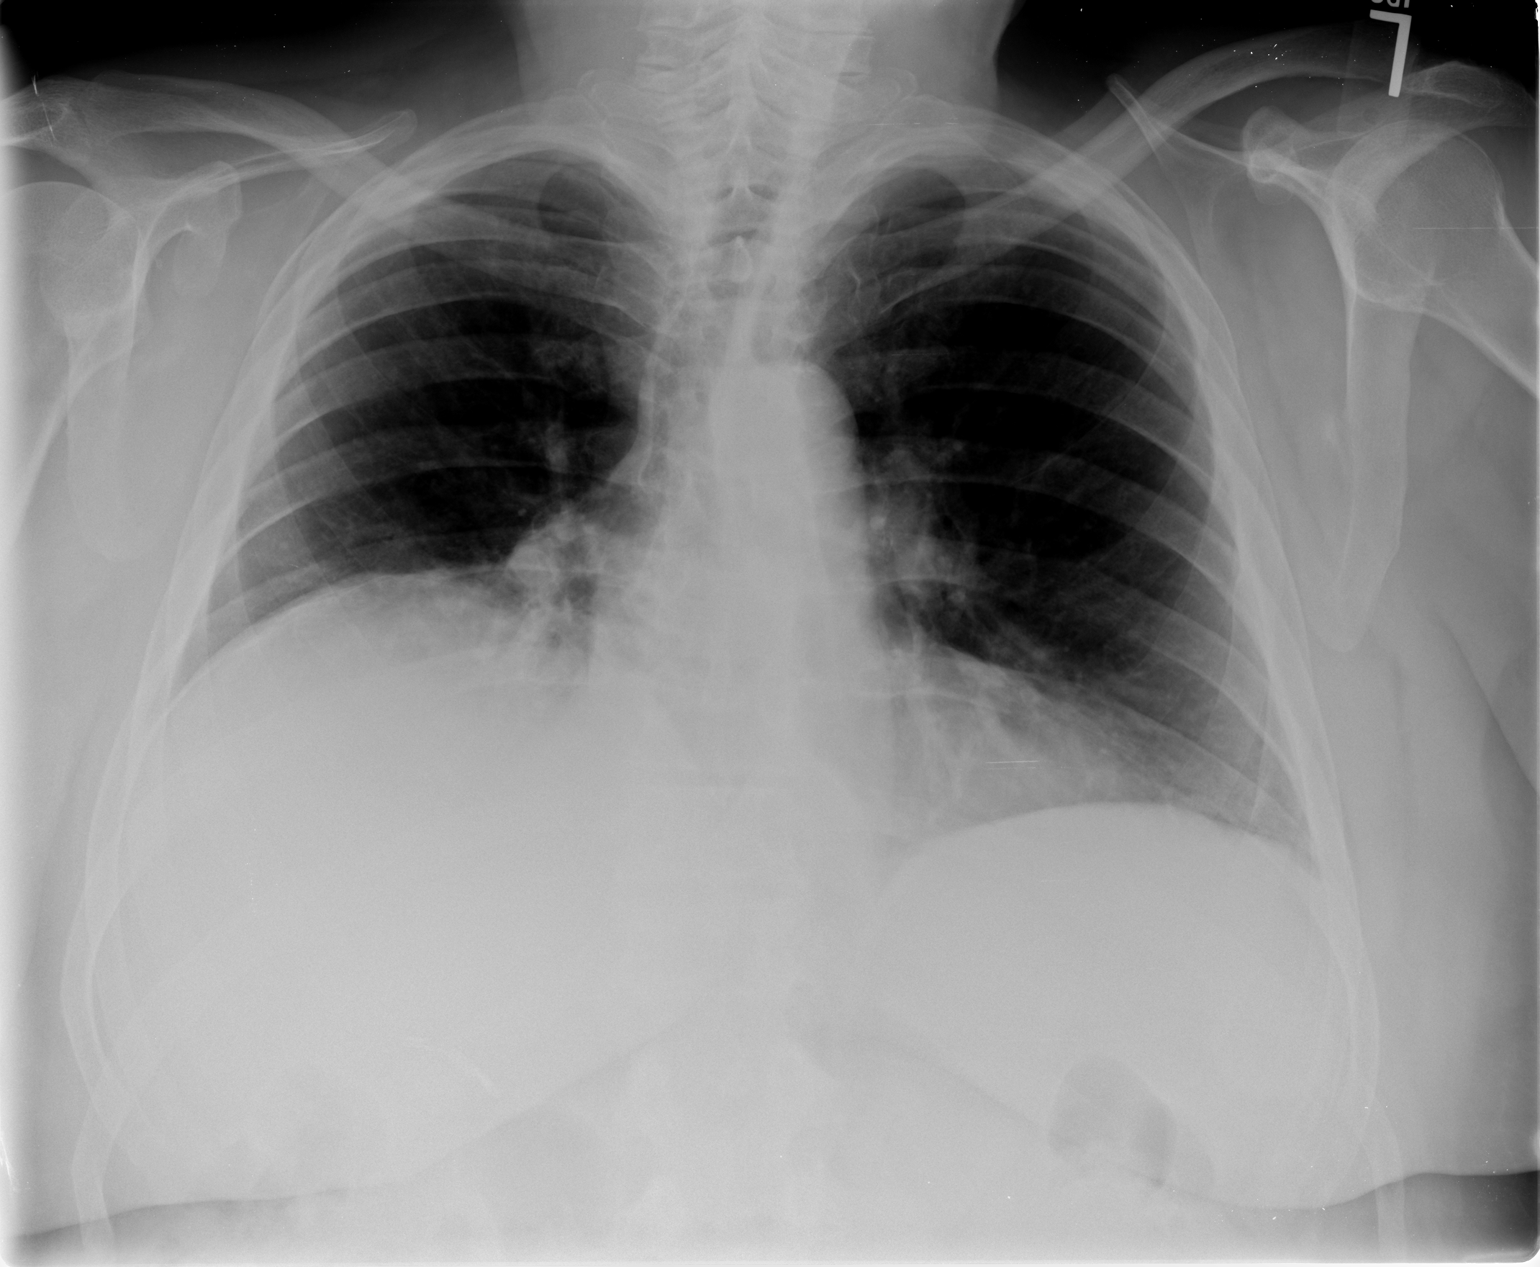

[1 of 1 positions shown; findings below may reference images not displayed]

FINDINGS: Low lung volumes with mild elevation of the right
hemidiaphragm is noted on the prior or chest radiograph report.
There is suspected subsegmental atelectasis in the right lower lobe
which may be secondary to the low lung volumes.

Given the low lung volumes, heart size is within normal limits.
Just to the left of midline there is a small retrocardiac
prominence which most likely represents a hiatal hernia.
IMPRESSION: 1.  Low lung volumes, with chronic elevation of the right
hemidiaphragm and potentially mild right basilar subsegmental
atelectasis.
2.  There is a suggestion of a hiatal hernia.

## 2011-02-15 ENCOUNTER — Other Ambulatory Visit: Payer: Self-pay | Admitting: Cardiovascular Disease

## 2011-03-06 IMAGING — CR DG SHOULDER 2+V PORT*R*
1 series · 1 of 1 positions shown · non-contrast
Comparison: MRI of the right shoulder 05/11/2009.

CLINICAL DATA: Status post surgery for right humerus infection.

PORTABLE RIGHT SHOULDER - 2+ VIEW

[view not recorded]
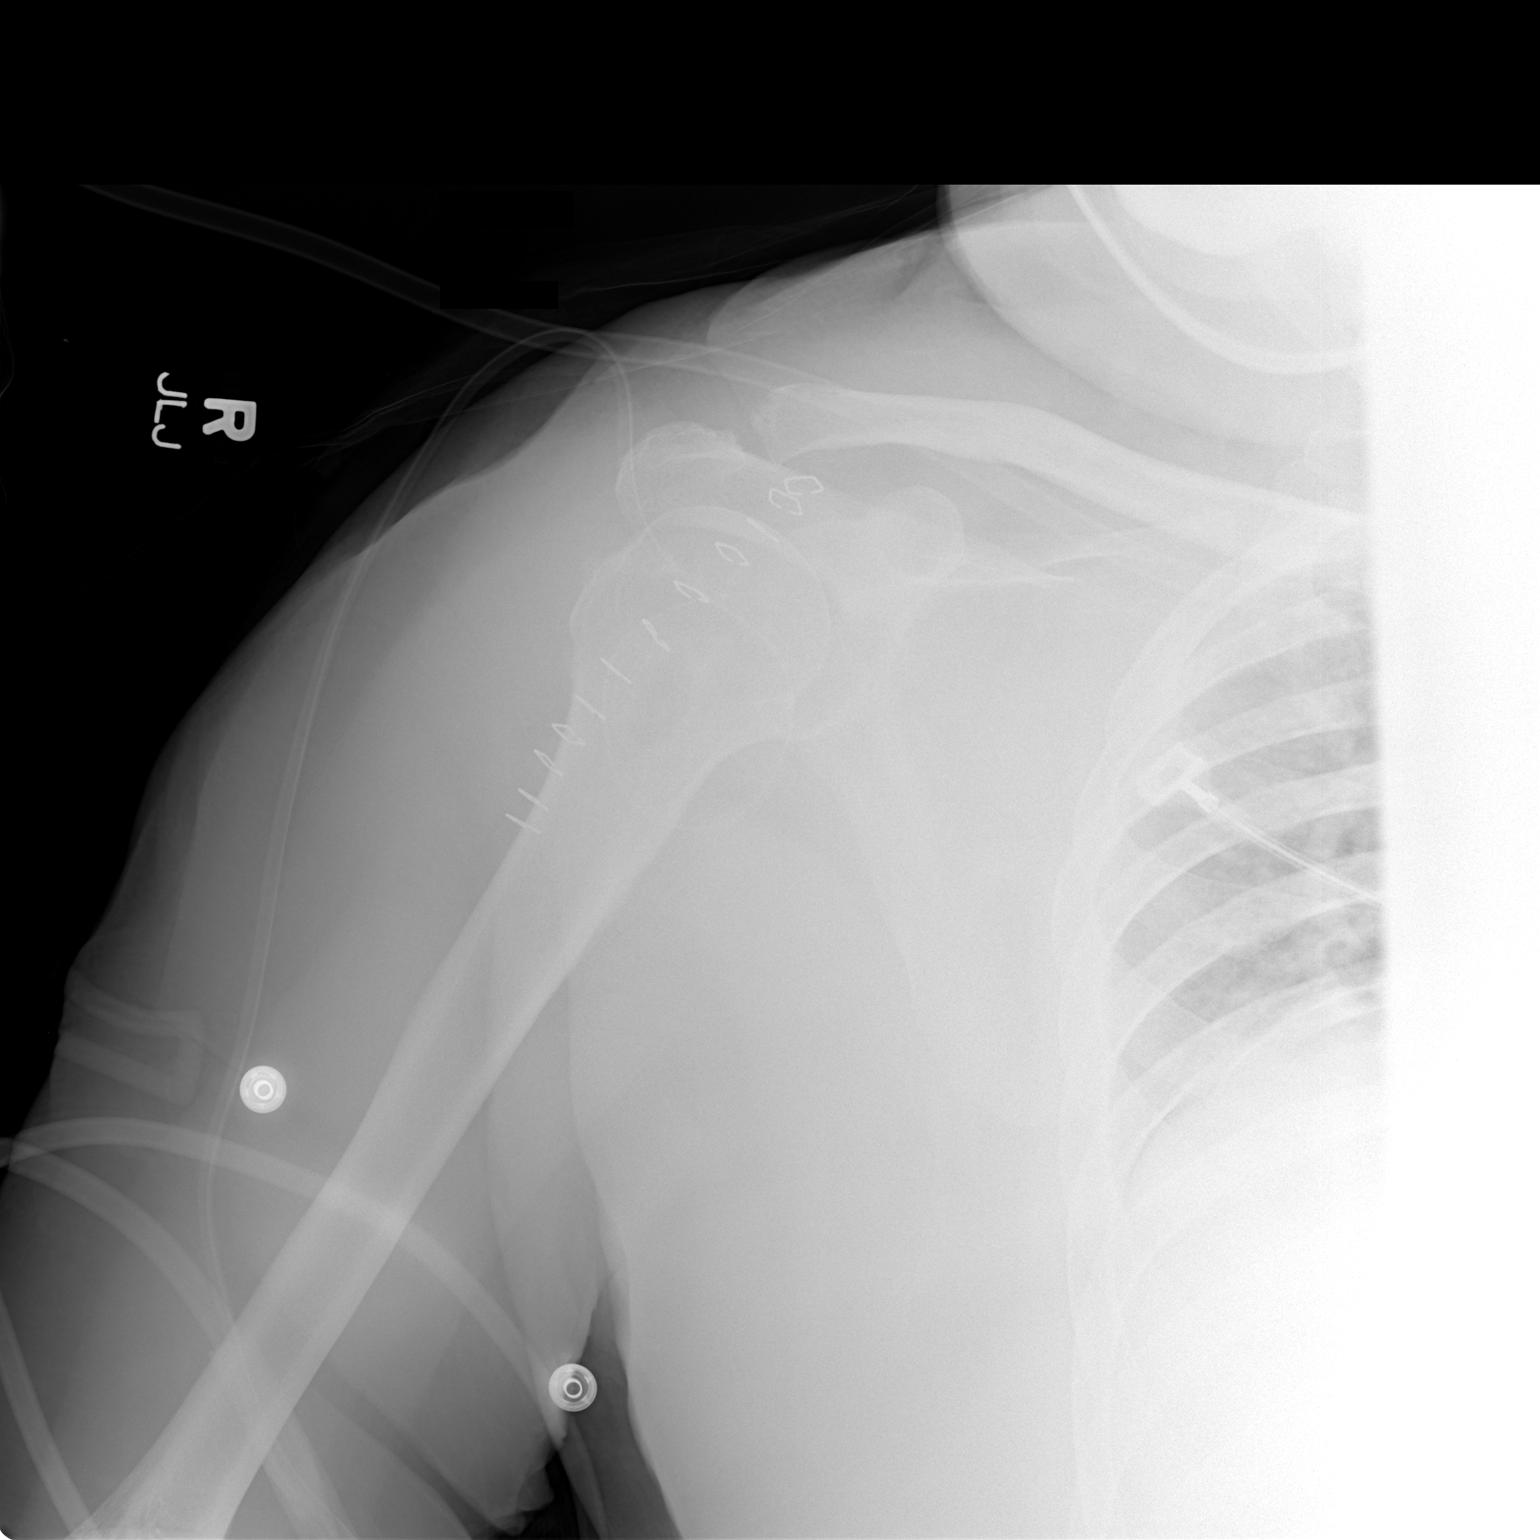

[1 of 1 positions shown; findings below may reference images not displayed]

FINDINGS: The patient is status post repeat surgery of the right
shoulder.  A surgical drain is in place.  The shoulder appears
located on this single view.  The right lung volume is low.
Degenerative changes are noted in the AC joint.
IMPRESSION: 1.  Status post surgery of the right shoulder.
2.  A surgical drain is in place.  The shoulder appears located on
this single image.

## 2011-03-30 ENCOUNTER — Other Ambulatory Visit: Payer: Self-pay | Admitting: Cardiovascular Disease

## 2011-08-13 ENCOUNTER — Telehealth: Payer: Self-pay | Admitting: Cardiology

## 2011-08-13 NOTE — Telephone Encounter (Signed)
New msg Pt called and said his BP has been low 113/67 . He has been dizzy for about three days

## 2011-08-13 NOTE — Telephone Encounter (Signed)
This is Dr. Harvie Bridge patient fowarded to Dr.Nahser's nurse.

## 2011-08-13 NOTE — Telephone Encounter (Signed)
App made for this week 

## 2011-08-16 ENCOUNTER — Ambulatory Visit (INDEPENDENT_AMBULATORY_CARE_PROVIDER_SITE_OTHER): Payer: Medicare Other | Admitting: Cardiovascular Disease

## 2011-08-16 ENCOUNTER — Encounter: Payer: Self-pay | Admitting: Cardiovascular Disease

## 2011-08-16 VITALS — BP 134/75 | HR 56 | Ht 63.0 in | Wt 243.0 lb

## 2011-08-16 DIAGNOSIS — I509 Heart failure, unspecified: Secondary | ICD-10-CM

## 2011-08-16 DIAGNOSIS — I519 Heart disease, unspecified: Secondary | ICD-10-CM

## 2011-08-16 DIAGNOSIS — I5189 Other ill-defined heart diseases: Secondary | ICD-10-CM

## 2011-08-16 MED ORDER — DOXAZOSIN MESYLATE 4 MG PO TABS: 4.0000 mg | ORAL_TABLET | Freq: Every day | ORAL | Status: DC

## 2011-08-16 MED ORDER — CARVEDILOL 25 MG PO TABS: 25.0000 mg | ORAL_TABLET | Freq: Two times a day (BID) | ORAL | Status: DC

## 2011-08-16 NOTE — Patient Instructions (Addendum)
Your physician recommends that you schedule a follow-up appointment in: 3 months. Your physician recommends that you continue on your current medications as directed. Please refer to the Current Medication list given to you today. 

## 2011-08-16 NOTE — Progress Notes (Addendum)
Roy Kane Date of Birth  20-Jun-1932       [facility] Office [address], [address]  [address], [address] [place], [place]  [address]   [place], [place]  [address] [phone]     [phone]   Fax  [phone]    Fax [phone]  Problem List: 1. Diastolic congestive heart failure 2. Obesity 3. Renal cell carcinoma-status post nephrectomy 4. Diabetes mellitus 5. Hypertension  History of Present Illness:  Roy Kane has been having right arm and elbow pain. He has had some dizziness - especially in the morning, ( orthostatic hypotension).  After he's had breakfast and has had something to drink he feels better. He feels quite well through the day.  He has not been exercising. He is fatigued and short of breath with any sort of exercise. He does not have any episodes of chest pain.  Current Outpatient Prescriptions on File Prior to Visit  Medication Sig Dispense Refill  . Calcium Carbonate Antacid (TUMS PO) Take by mouth daily.       . carvedilol (COREG) 12.5 MG tablet TAKE 1 TABLET BY MOUTH TWICE DAILY  180 tablet  1  . furosemide (LASIX) 40 MG tablet Take 40 mg by mouth daily. Taking 60mg . daily      . insulin aspart (NOVOLOG) 100 UNIT/ML injection Inject into the skin 3 (three) times daily before meals. Sliding Scale      . Insulin Glargine (LANTUS [place]) Inject 20 Units into the skin daily.      Marland Kitchen lisinopril (PRINIVIL,ZESTRIL) 10 MG tablet Take 10 mg by mouth daily.      . Multiple Vitamin (MULTIVITAMIN) tablet Take 1 tablet by mouth daily.        . simvastatin (ZOCOR) 40 MG tablet Take 40 mg by mouth at bedtime.          Allergies  Allergen Reactions  . Neomycin-Polymyxin-Dexameth     REACTION: eye ointment caused eye swelling    Past Medical History  Diagnosis Date  . Obesity, morbid   . Diastolic dysfunction     CHRONIC  . Renal cell carcinoma   . S/p nephrectomy   . Hyperkalemia   . Hypertension   . Eye infection     CHRONIC  LEFT EYE  . Diabetes mellitus   . Venous insufficiency     Past Surgical History  Procedure Date  . Total knee arthroplasty   . Hernia repair     BILATERAL  . Prostate surgery     History  Smoking status  . Former Smoker  . Types: Cigarettes  . Quit date: 02/19/1980  Smokeless tobacco  . Not on file    History  Alcohol Use     No family history on file.  Reviw of Systems:  Reviewed in the HPI.  All other systems are negative.  Physical Exam: Blood pressure 134/75, pulse 56, height 5\' 3"  (1.6 m), weight 243 lb (110.224 kg). General: Well developed, well nourished, in no acute distress.  Head: Normocephalic, atraumatic, sclera non-icteric, mucus membranes are moist,   Neck: Supple. Carotids are 2 + without bruits. No JVD  Lungs: Clear bilaterally to auscultation.  Heart: regular rate.  normal  S1 S2. No murmurs, gallops or rubs.  Abdomen: Soft, non-tender, non-distended with normal bowel sounds. No hepatomegaly. No rebound/guarding. No masses.  Msk:  Strength and tone are normal  Extremities: No clubbing or cyanosis. Trace edema.  Distal pedal pulses are  1+ and equal bilaterally.  Neuro: Alert and oriented X 3. Moves all extremities spontaneously.  Psych:  Responds to questions appropriately with a normal affect.  ECG: August 16, 2011 - sinus brady at 77.  Assessment / Plan:

## 2011-08-16 NOTE — Assessment & Plan Note (Addendum)
Boleslaus seems to be doing fairly well. He has a history of diastolic congestive heart failure but this seems to be fairly well controlled. His LAD lots of other medical issues including chronic renal insufficiency he also has had one of his eyes removed.  He has complaints of some orthostatic hypotension. I've asked him to drink a little bit of extra water in the morning. He drinks only coffee in  the morning and usually is several hours before he stopped having the dizziness. He's never had episodes of dizziness later that day or the evening. I've asked him to try to drink a little chicken broth or other fluid that may help with his orthostasis. I've asked him to make sure that he doesn't go over a board with the salt intake or else he'll develop congestive heart therapy.  I'll see him again in 3 months for followup visit. We'll check labs at that time including a basic metabolic profile.

## 2011-11-04 ENCOUNTER — Ambulatory Visit: Payer: Medicare Other | Admitting: Cardiovascular Disease

## 2011-11-06 ENCOUNTER — Encounter: Payer: Self-pay | Admitting: Cardiology

## 2011-11-06 ENCOUNTER — Encounter: Payer: Self-pay | Admitting: Cardiovascular Disease

## 2011-11-07 ENCOUNTER — Encounter: Payer: Self-pay | Admitting: Cardiovascular Disease

## 2011-11-26 ENCOUNTER — Ambulatory Visit (INDEPENDENT_AMBULATORY_CARE_PROVIDER_SITE_OTHER): Payer: Medicare Other | Admitting: Cardiovascular Disease

## 2011-11-26 ENCOUNTER — Encounter: Payer: Self-pay | Admitting: Cardiovascular Disease

## 2011-11-26 VITALS — BP 124/70 | HR 61 | Ht 63.0 in | Wt 243.1 lb

## 2011-11-26 DIAGNOSIS — I5189 Other ill-defined heart diseases: Secondary | ICD-10-CM

## 2011-11-26 DIAGNOSIS — I519 Heart disease, unspecified: Secondary | ICD-10-CM

## 2011-11-26 DIAGNOSIS — I10 Essential (primary) hypertension: Secondary | ICD-10-CM

## 2011-11-26 DIAGNOSIS — R05 Cough: Secondary | ICD-10-CM | POA: Insufficient documentation

## 2011-11-26 MED ORDER — LOSARTAN POTASSIUM 50 MG PO TABS: 50.0000 mg | ORAL_TABLET | Freq: Every day | ORAL | Status: DC

## 2011-11-26 NOTE — Assessment & Plan Note (Signed)
I suspect this may be due to an ACE inhibitor. We will discontinue the lisinopril and start him on losartan 50 mg a day. I've asked him to call me if this doesn't resolve the problem.

## 2011-11-26 NOTE — Assessment & Plan Note (Addendum)
Samie seems to be doing fairly well. His lungs sound clear. I suspect that his cough is due to the lisinopril therapy. He has tolerated it for years but this certainly could be a side effect of the medication. We will discontinue it and start him on losartan 50 mg a day.  I've encouraged him to lose weight. His chronic diastolic congestive heart failure seems to be fairly well controlled.  We will get a repeat echo in 6 months.

## 2011-11-26 NOTE — Patient Instructions (Addendum)
Your physician wants you to follow-up in:3 months You will receive a reminder letter in the mail two months in advance. If you don't receive a letter, please call our office to schedule the follow-up appointment.  Your physician has recommended you make the following change in your medication:   Stop lisinopril due to cough Start losartan 50 mg daily  Your physician has requested that you have an echocardiogram in 6 months . Echocardiography is a painless test that uses sound waves to create images of your heart. It provides your doctor with information about the size and shape of your heart and how well your heart's chambers and valves are working. This procedure takes approximately one hour. There are no restrictions for this procedure.

## 2011-11-26 NOTE — Progress Notes (Signed)
Roy Kane Date of Birth  Feb 27, 1932       [facility] Office [address]  [address] [place], [place]  [address]   [place], [place]  [address] [phone]     [phone]   Fax  [phone]    Fax [phone]  Problem List: 1. Diastolic congestive heart failure 2. Obesity 3. Renal cell carcinoma-status post nephrectomy 4. Diabetes mellitus 5. Hypertension  History of Present Illness:  Roy Kane has been having right arm and elbow pain. He has had some dizziness - especially in the morning, ( orthostatic hypotension).  After he's had breakfast and has had something to drink he feels better. He feels quite well through the day.  He has not been exercising. He is fatigued and short of breath with any sort of exercise. He does not have any episodes of chest pain.  Oct. 8, 2013 Roy Kane is still having some dyspnea.  He also has had a cough  - lasts all day and night.  He is on Lisinopril.  He still has some dizziness - doesn't feel comfortable with walking and as a result he is not exercising.  He thinks this is at least partially due to his loss of eyesite and lack of peripheral vision.  Current Outpatient Prescriptions on File Prior to Visit  Medication Sig Dispense Refill  . calcitRIOL (ROCALTROL) 0.5 MCG capsule Take 0.5 mcg by mouth daily.      . Calcium Carbonate Antacid (TUMS PO) Take by mouth daily.       . carvedilol (COREG) 25 MG tablet Take 1 tablet (25 mg total) by mouth 2 (two) times daily with a meal.  180 tablet  1  . doxazosin (CARDURA) 4 MG tablet Take 1 tablet (4 mg total) by mouth at bedtime.      . furosemide (LASIX) 40 MG tablet Take 40 mg by mouth daily. Taking 60mg . daily      . insulin aspart (NOVOLOG) 100 UNIT/ML injection Inject into the skin 3 (three) times daily before meals. Sliding Scale      . Insulin Glargine (LANTUS [place]) Inject 20 Units into the skin daily.      Marland Kitchen lisinopril (PRINIVIL,ZESTRIL)  10 MG tablet Take 10 mg by mouth daily.      . Multiple Vitamin (MULTIVITAMIN) tablet Take 1 tablet by mouth daily.        . simvastatin (ZOCOR) 40 MG tablet Take 40 mg by mouth at bedtime.          Allergies  Allergen Reactions  . Neomycin-Polymyxin-Dexameth     REACTION: eye ointment caused eye swelling    Past Medical History  Diagnosis Date  . Obesity, morbid   . Diastolic dysfunction     CHRONIC  . Renal cell carcinoma   . S/p nephrectomy   . Hyperkalemia   . Hypertension   . Eye infection     CHRONIC LEFT EYE  . Diabetes mellitus   . Venous insufficiency     Past Surgical History  Procedure Date  . Total knee arthroplasty   . Hernia repair     BILATERAL  . Prostate surgery     History  Smoking status  . Former Smoker  . Types: Cigarettes  . Quit date: 02/19/1980  Smokeless tobacco  . Not on file    History  Alcohol Use     No family history on file.  Reviw of Systems:  Reviewed in the HPI.  All other systems are negative.  Physical Exam: Blood pressure 124/70, pulse 61, height 5\' 3"  (1.6 m), weight 243 lb 1.9 oz (110.279 kg), SpO2 97.00%. General: Well developed, well nourished, in no acute distress.  Head: Normocephalic, atraumatic, sclera non-icteric, mucus membranes are moist,   Neck: Supple. Carotids are 2 + without bruits. No JVD  Lungs: Clear bilaterally to auscultation.  Heart: regular rate.  normal  S1 S2. No murmurs, gallops or rubs.  Abdomen: Soft, non-tender, non-distended with normal bowel sounds. No hepatomegaly. No rebound/guarding. No masses.  Msk:  Strength and tone are normal  Extremities: No clubbing or cyanosis. Trace edema.  Distal pedal pulses are 1+ and equal bilaterally.  Neuro: Alert and oriented X 3. Moves all extremities spontaneously.  Psych:  Responds to questions appropriately with a normal affect.  ECG:   Assessment / Plan:

## 2012-01-21 ENCOUNTER — Other Ambulatory Visit: Payer: Self-pay | Admitting: *Deleted

## 2012-01-21 DIAGNOSIS — I509 Heart failure, unspecified: Secondary | ICD-10-CM

## 2012-01-21 NOTE — Telephone Encounter (Signed)
Opened in Error.

## 2012-01-22 ENCOUNTER — Other Ambulatory Visit: Payer: Self-pay | Admitting: *Deleted

## 2012-01-22 DIAGNOSIS — I509 Heart failure, unspecified: Secondary | ICD-10-CM

## 2012-01-22 MED ORDER — DOXAZOSIN MESYLATE 4 MG PO TABS: 4.0000 mg | ORAL_TABLET | Freq: Every day | ORAL | Status: DC

## 2012-01-22 NOTE — Telephone Encounter (Signed)
Fax Received. Refill Completed. Jenny Lai Chowoe (R.M.A)   

## 2012-01-22 NOTE — Telephone Encounter (Signed)
Opened in Error.

## 2012-01-30 ENCOUNTER — Other Ambulatory Visit: Payer: Self-pay | Admitting: *Deleted

## 2012-01-30 NOTE — Telephone Encounter (Signed)
Opened in Error.

## 2012-02-27 ENCOUNTER — Encounter: Payer: Self-pay | Admitting: Cardiovascular Disease

## 2012-02-27 ENCOUNTER — Ambulatory Visit (INDEPENDENT_AMBULATORY_CARE_PROVIDER_SITE_OTHER): Payer: Medicare Other | Admitting: Cardiovascular Disease

## 2012-02-27 VITALS — BP 143/75 | HR 63 | Ht 63.0 in | Wt 246.0 lb

## 2012-02-27 DIAGNOSIS — I10 Essential (primary) hypertension: Secondary | ICD-10-CM

## 2012-02-27 NOTE — Progress Notes (Signed)
Roy Kane Date of Birth  Oct 31, 1932       [facility] Office [address]  [address] [place], [place]  [address]   [place], [place]  [address] [phone]     [phone]   Fax  [phone]    Fax [phone]  Problem List: 1. Diastolic congestive heart failure 2. Obesity 3. Renal cell carcinoma-status post nephrectomy 4. Diabetes mellitus 5. Hypertension  History of Present Illness:  Roy Kane has been having right arm and elbow pain. He has had some dizziness - especially in the morning, ( orthostatic hypotension).  After he's had breakfast and has had something to drink he feels better. He feels quite well through the day.  He has not been exercising. He is fatigued and short of breath with any sort of exercise. He does not have any episodes of chest pain.  Oct. 8, 2013 Roy Kane is still having some dyspnea.  He also has had a cough  - lasts all day and night.  He is on Lisinopril.  He still has some dizziness - doesn't feel comfortable with walking and as a result he is not exercising.  He thinks this is at least partially due to his loss of eyesite and lack of peripheral vision.  February 27, 2012: He has had a chronic cough.  We DCd his ACE inhibitor and started Losartan but this did not help.  He had a lung CT which revealed significant opacification on the left side of his lung.  Current Outpatient Prescriptions on File Prior to Visit  Medication Sig Dispense Refill  . calcitRIOL (ROCALTROL) 0.5 MCG capsule Take 0.5 mcg by mouth daily.      . Calcium Carbonate Antacid (TUMS PO) Take by mouth daily.       . carvedilol (COREG) 25 MG tablet Take 1 tablet (25 mg total) by mouth 2 (two) times daily with a meal.  180 tablet  1  . doxazosin (CARDURA) 4 MG tablet Take 1 tablet (4 mg total) by mouth at bedtime.  90 tablet  3  . furosemide (LASIX) 40 MG tablet Take 40 mg by mouth daily. Taking 60mg . daily      . insulin  aspart (NOVOLOG) 100 UNIT/ML injection Inject into the skin 3 (three) times daily before meals. Sliding Scale      . Insulin Glargine (LANTUS [place]) Inject 20 Units into the skin daily.      Marland Kitchen losartan (COZAAR) 50 MG tablet Take 1 tablet (50 mg total) by mouth daily.  90 tablet  3  . Multiple Vitamin (MULTIVITAMIN) tablet Take 1 tablet by mouth daily.        . simvastatin (ZOCOR) 40 MG tablet Take 40 mg by mouth at bedtime.          Allergies  Allergen Reactions  . Lisinopril Cough  . Neomycin-Polymyxin-Dexameth     REACTION: eye ointment caused eye swelling    Past Medical History  Diagnosis Date  . Obesity, morbid   . Diastolic dysfunction     CHRONIC  . Renal cell carcinoma   . S/p nephrectomy   . Hyperkalemia   . Hypertension   . Eye infection     CHRONIC LEFT EYE  . Diabetes mellitus   . Venous insufficiency     Past Surgical History  Procedure Date  . Total knee arthroplasty   . Hernia repair     BILATERAL  . Prostate surgery  History  Smoking status  . Former Smoker  . Types: Cigarettes  . Quit date: 02/19/1980  Smokeless tobacco  . Not on file    History  Alcohol Use     No family history on file.  Reviw of Systems:  Reviewed in the HPI.  All other systems are negative.  Physical Exam: Blood pressure 143/75, pulse 63, height 5\' 3"  (1.6 m), weight 246 lb (111.585 kg). General: Well developed, well nourished, in no acute distress.  Head: Normocephalic, atraumatic, sclera non-icteric, mucus membranes are moist,   Neck: Supple. Carotids are 2 + without bruits. No JVD  Lungs: Clear bilaterally to auscultation.  Heart: regular rate.  normal  S1 S2. No murmurs, gallops or rubs.  Abdomen: Soft, non-tender, non-distended with normal bowel sounds. No hepatomegaly. No rebound/guarding. No masses.  Msk:  Strength and tone are normal  Extremities: No clubbing or cyanosis. Trace edema.  Distal pedal pulses are 1+ and equal bilaterally.  Neuro: Alert  and oriented X 3. Moves all extremities spontaneously.  Psych:  Responds to questions appropriately with a normal affect.  ECG:   Assessment / Plan:

## 2012-02-27 NOTE — Patient Instructions (Addendum)
Your physician wants you to follow-up in: 6 months You will receive a reminder letter in the mail two months in advance. If you don't receive a letter, please call our office to schedule the follow-up appointment.  Your physician recommends that you return for a FASTING lipid profile: 6 months   Your physician recommends that you continue on your current medications as directed. Please refer to the Current Medication list given to you today.  

## 2012-02-27 NOTE — Assessment & Plan Note (Signed)
We'll continue with his current medications. I've asked her to try to work on a good diet and exercise and weight loss program.  His blood pressure today is relatively normal. He states his blood pressure at home is in the 120/80 range.

## 2012-04-08 ENCOUNTER — Ambulatory Visit (INDEPENDENT_AMBULATORY_CARE_PROVIDER_SITE_OTHER): Payer: Medicare Other | Admitting: Internal Medicine

## 2012-04-08 ENCOUNTER — Encounter: Payer: Self-pay | Admitting: Internal Medicine

## 2012-04-08 VITALS — BP 140/88 | HR 63 | Temp 98.1°F | Ht 63.0 in | Wt 251.8 lb

## 2012-04-08 NOTE — Progress Notes (Signed)
Subjective:    Patient ID: Roy Kane, male    DOB: 01-19-1933, 79 y.o.   MRN: [id number]  HPI PCP [name], PA Cardiologist is Dr. [name] - [place] Nephrologist is Dr. [name] at [facility]   Body mass index is 44.62 kg/(m^2).  reports that he quit smoking about 32 years ago. His smoking use included Cigarettes. He has a 37.5 pack-year smoking history. He does not have any smokeless tobacco history on file.  IOV 04/08/2012 Referred for question of elevated right hemidiaphragm and dyspnea  79 year old morbidly obese male accompanied by his wife.  - Reports dyspnea on exertion. Insidious onset. Dyspneic for many many years. He does not recollect onset. For the last few years or more precisely 6 years it is progressive following extensive abdominal wall hernia repair with status post large mesh placement at [facility] per his history. He quit smoking 30 years ago. This has currently brought on by simple activities such as went from room to room or bending too tie shoe lace. Dyspnea is relieved by rest and sitting. He does use oxygen at night but does not seem to help. There is no associated cough or chest pain no wheezing.  -He recollects pulmonary function testing in year 2000 in [place] and was told that his lung was "down". He does not recollect anyone mentioning to him about right hemidiaphragm paralysis. Most recently a few months ago and he was in [place] visiting his daughter he tripped and fell and fractured his left ribs. He returned to [facility] to get worked up. He had a chest x-ray that resulted in a CT scan. 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 [name] is mild increased interstitial markings within the left lung base the CT was in interpreted by [name]. He is only now aware of the elevated hemidiaphragm but review of her  records show that he had it on chest x-rays within our system even back in 2011.  - He denies diagnosis of COPD though he was a 40 pack smoker quitting 30 years ago  - He is morbidly obese with a BMI of 44.6. His only weight gain was 50 pounds 10 years ago following left knee replacement. Since then weight stable. He has difficulty controlling his weight and losing it. Apparently he is on a renal diet and therefore he has consume high carbohydrate foods which makes it difficult for him to lose weight. He is open to the idea of seeing Dr. [name] at [facility] endocrinology clinic  - He has a history of multiple abdominal wall hernias with a protuberant abdomen. 6 years ago he had a mesh placed  - Denies occupational exposure and pulmonary embolism   Past Medical History  Diagnosis Date  . Obesity, morbid   . Diastolic dysfunction     CHRONIC  . Renal cell carcinoma   . S/p nephrectomy   . Hyperkalemia   . Hypertension   . Eye infection     CHRONIC LEFT EYE  . Diabetes mellitus   . Venous insufficiency      Family History  Problem Relation Age of Onset  . Heart disease Father     CHF  . Cancer Paternal Grandmother      History   Social History  . Marital Status: Married    Spouse Name: N/A    Number of  Children: N/A  . Years of Education: N/A   Occupational History  . Not on file.   Social History Main Topics  . Smoking status: Former Smoker -- 1.50 packs/day for 25 years    Types: Cigarettes    Quit date: 02/19/1980  . Smokeless tobacco: Not on file  . Alcohol Use: No  . Drug Use: No  . Sexually Active: Not on file   Other Topics Concern  . Not on file   Social History Narrative  . No narrative on file     Allergies  Allergen Reactions  . Lisinopril Cough  . Neomycin-Polymyxin-Dexameth     REACTION: eye ointment caused eye swelling     Outpatient Prescriptions Prior to Visit  Medication Sig Dispense Refill  . calcitRIOL (ROCALTROL) 0.5 MCG capsule  Take 0.5 mcg by mouth daily.      . Calcium Carbonate Antacid (TUMS PO) Take by mouth daily.       . carvedilol (COREG) 25 MG tablet Take 1 tablet (25 mg total) by mouth 2 (two) times daily with a meal.  180 tablet  1  . doxazosin (CARDURA) 4 MG tablet Take 1 tablet (4 mg total) by mouth at bedtime.  90 tablet  3  . insulin aspart (NOVOLOG) 100 UNIT/ML injection Inject into the skin 3 (three) times daily before meals. Sliding Scale      . Insulin Glargine (LANTUS [place]) Inject 20 Units into the skin daily.      . Multiple Vitamin (MULTIVITAMIN) tablet Take 1 tablet by mouth daily.        . furosemide (LASIX) 40 MG tablet Take 40 mg by mouth daily. Taking 60mg . daily      . losartan (COZAAR) 50 MG tablet Take 1 tablet (50 mg total) by mouth daily.  90 tablet  3  . simvastatin (ZOCOR) 40 MG tablet Take 40 mg by mouth at bedtime.         No facility-administered medications prior to visit.       Review of Systems  Constitutional: Negative for fever and unexpected weight change.  HENT: Positive for sneezing and dental problem. Negative for ear pain, nosebleeds, congestion, sore throat, rhinorrhea, trouble swallowing, postnasal drip and sinus pressure.   Eyes: Negative for redness and itching.  Respiratory: Positive for cough and shortness of breath. Negative for chest tightness and wheezing.   Cardiovascular: Negative for palpitations and leg swelling.  Gastrointestinal: Negative for nausea and vomiting.  Genitourinary: Negative for dysuria.  Musculoskeletal: Negative for joint swelling.  Skin: Negative for rash.  Neurological: Negative for headaches.  Hematological: Does not bruise/bleed easily.  Psychiatric/Behavioral: Negative for dysphoric mood. The patient is not nervous/anxious.    Current outpatient prescriptions:calcitRIOL (ROCALTROL) 0.5 MCG capsule, Take 0.5 mcg by mouth daily., Disp: , Rfl: ;  Calcium Carbonate Antacid (TUMS PO), Take by mouth daily. , Disp: , Rfl: ;  carvedilol  (COREG) 25 MG tablet, Take 1 tablet (25 mg total) by mouth 2 (two) times daily with a meal., Disp: 180 tablet, Rfl: 1;  doxazosin (CARDURA) 4 MG tablet, Take 1 tablet (4 mg total) by mouth at bedtime., Disp: 90 tablet, Rfl: 3 furosemide (LASIX) 20 MG tablet, Take 60 mg by mouth daily., Disp: , Rfl: ;  insulin aspart (NOVOLOG) 100 UNIT/ML injection, Inject into the skin 3 (three) times daily before meals. Sliding Scale, Disp: , Rfl: ;  Insulin Glargine (LANTUS [place]), Inject 20 Units into the skin daily., Disp: , Rfl: ;  lisinopril (PRINIVIL,ZESTRIL)  10 MG tablet, Take 1 tablet by mouth daily., Disp: , Rfl:  Multiple Vitamin (MULTIVITAMIN) tablet, Take 1 tablet by mouth daily.  , Disp: , Rfl:      Objective:   Physical Exam  Nursing note and vitals reviewed. Constitutional: He is oriented to person, place, and time. He appears well-developed and well-nourished. No distress.  Short and obese  HENT:  Head: Normocephalic and atraumatic.  Right Ear: External ear normal.  Left Ear: External ear normal.  Mouth/Throat: Oropharynx is clear and moist. No oropharyngeal exudate.  Mallampati class IV  Eyes: Conjunctivae and EOM are normal. Pupils are equal, round, and reactive to light. Right eye exhibits no discharge. Left eye exhibits no discharge. No scleral icterus.  Neck: Normal range of motion. Neck supple. No JVD present. No tracheal deviation present. No thyromegaly present.  . Neck is stout  Cardiovascular: Normal rate, regular rhythm and intact distal pulses.  Exam reveals no gallop and no friction rub.   No murmur heard. Pulmonary/Chest: Effort normal and breath sounds normal. No respiratory distress. He has no wheezes. He has no rales. He exhibits no tenderness.  Diminished breath sounds right base along with dullness  Abdominal: Soft. Bowel sounds are normal. He exhibits no distension and no mass. There is no tenderness. There is no rebound and no guarding.  Musculoskeletal: Normal range of  motion. He exhibits no edema and no tenderness.  Lymphadenopathy:    He has no cervical adenopathy.  Neurological: He is alert and oriented to person, place, and time. He has normal reflexes. No cranial nerve deficit. Coordination normal.  Joking with good sense of humor  Skin: Skin is warm and dry. No rash noted. He is not diaphoretic. No erythema. No pallor.  Psychiatric: He has a normal mood and affect. His behavior is normal. Judgment and thought content normal.          Assessment & Plan:

## 2012-04-08 NOTE — Assessment & Plan Note (Signed)
Dyspnea likely due to her morbid obesity and loss of right diaphragmaticfunction. In addition  COPD might be present. I will have him do sniff test, full pulmonary function test, walk test for oxygen desaturation and see him back for followup. Ultimately I feel that the only solution his is weight loss which is going to be challenged by his power and renal failure/dysfunction. He is open to the idea of going to [facility] endocrine clinic but we will address this at followup

## 2012-04-08 NOTE — Patient Instructions (Addendum)
Please have sniff test by radiology Please have full pulmonary function test Please bring the CD-ROM of the CT scan done on December 20 13,013 at  on [facility] Do walk test in the office today or at followup [name] all this by first week of March 2014 and return to me for followup

## 2012-04-10 ENCOUNTER — Telehealth: Payer: Self-pay | Admitting: Cardiovascular Disease

## 2012-04-10 NOTE — Telephone Encounter (Signed)
error 

## 2012-04-13 ENCOUNTER — Telehealth: Payer: Self-pay | Admitting: Internal Medicine

## 2012-04-13 ENCOUNTER — Ambulatory Visit (HOSPITAL_COMMUNITY)
Admission: RE | Admit: 2012-04-13 | Discharge: 2012-04-13 | Disposition: A | Discharge status: Home / Self Care | Payer: Medicare Other | Source: Ambulatory Visit | Attending: Internal Medicine | Admitting: Internal Medicine

## 2012-04-13 ENCOUNTER — Other Ambulatory Visit (HOSPITAL_COMMUNITY): Payer: Medicare Other

## 2012-04-13 DIAGNOSIS — R0600 Dyspnea, unspecified: Secondary | ICD-10-CM

## 2012-04-13 DIAGNOSIS — R062 Wheezing: Secondary | ICD-10-CM | POA: Insufficient documentation

## 2012-04-13 DIAGNOSIS — R0609 Other forms of dyspnea: Secondary | ICD-10-CM | POA: Insufficient documentation

## 2012-04-13 DIAGNOSIS — J986 Disorders of diaphragm: Secondary | ICD-10-CM | POA: Insufficient documentation

## 2012-04-13 DIAGNOSIS — R0989 Other specified symptoms and signs involving the circulatory and respiratory systems: Secondary | ICD-10-CM | POA: Insufficient documentation

## 2012-04-13 DIAGNOSIS — R0602 Shortness of breath: Secondary | ICD-10-CM | POA: Insufficient documentation

## 2012-04-13 LAB — PULMONARY FUNCTION TEST

## 2012-04-13 MED ORDER — ALBUTEROL SULFATE (5 MG/ML) 0.5% IN NEBU
2.5000 mg | INHALATION_SOLUTION | Freq: Once | RESPIRATORY_TRACT | Status: AC
  Administered 2012-04-13: 2.5 mg via RESPIRATORY_TRACT

## 2012-04-13 NOTE — Telephone Encounter (Signed)
PFT is being faxed to [phone]. I advised the pt that you will be calling with results. Call them at # [phone]. Carron Curie, CMA

## 2012-04-13 NOTE — Telephone Encounter (Signed)
Victorino Dike, will you please see if there is any way to get this patient in sooner with MR. Thanks.

## 2012-04-13 NOTE — Telephone Encounter (Signed)
Fax pft to [phone]. I think they are planning trip to [place] soon.  I will call them tomorrow 04/14/12 or 04/15/12 to go over results. Let me know once faxed. Call [phone] to get fax # and tell them before faxing

## 2012-04-13 NOTE — Telephone Encounter (Signed)
Pt has PFT and Sniff test today. They are upset that you are not here this week for them to be seen to review results. Please advise on what to do. Do you want to give results over the phone and then schedule follow-up for first available? Carron Curie, CMA

## 2012-04-13 NOTE — Telephone Encounter (Signed)
pft shows  - fev1 1.3L/63%,  FVC 1.7L/57% BD response 7% only Rato 78/107% Small airways 77%   All above, c/w restriction  TLC though is 4.8L/84%  Normal  DLCO 10.3L/45% - reduced diffusion   Will await sniff test  Dr. Kalman Shan, M.D., [facility].C.P Pulmonary and Critical Care Medicine Staff Physician [facility] Pager: [phone], If no answer or between  15:00h - 7:00h: call [phone]  04/13/2012 5:44 PM

## 2012-04-14 NOTE — Telephone Encounter (Signed)
Pt is aware. Jennifer Castillo, CMA  

## 2012-04-14 NOTE — Telephone Encounter (Signed)
Sniff test result not read out yet. Will call him tomorrow; hopefully will be refulsted by then. Please let him know that and get back to me so this is in my inbox as reminder

## 2012-04-15 NOTE — Telephone Encounter (Signed)
Called radiology, spoke with Erskine Squibb in [facility] > she stated that pt's sniff test was "not on the list to be read."  Dala Dock that pt is very anxious for the results.  Erskine Squibb stated that she will be sure this is taken care of.  Will forward back to MR as requested.  Pt's wife is aware that we are working on this.

## 2012-04-15 NOTE — Telephone Encounter (Signed)
Sniff test result not out yet. Do not know why. Very anxious patient  Please  - call radiology and find out why delay sending to my inbox and no result yet ? Please tell t hem to read asap - please tell patient still no result and we are working on getting this tomorrow 04/16/12  Please   - send note back to me after above

## 2012-04-17 ENCOUNTER — Telehealth: Payer: Self-pay | Admitting: Internal Medicine

## 2012-04-17 NOTE — Telephone Encounter (Signed)
Spoke to patient. Test is normal. PFTs is inconclusive.  We'll try  Empiric Dulera 2 puff twice daily  - to six-week sample In addition, set up overnight pulse oximetry at home to be done first week March 2014 [ordered on] Also make referral to [facility] endocrine clinic Dr. [name] [order done) for weight loss Please give him appointment to see me in the third week of March 2014 a few days before his trip to [place]

## 2012-04-17 NOTE — Telephone Encounter (Signed)
Pt is aware. He has been scheduled for 04/21/12 @ 1:45pm. He will wait until his appointment to pick up [facility].

## 2012-04-21 ENCOUNTER — Ambulatory Visit (INDEPENDENT_AMBULATORY_CARE_PROVIDER_SITE_OTHER): Payer: Medicare Other | Admitting: Internal Medicine

## 2012-04-21 ENCOUNTER — Encounter: Payer: Self-pay | Admitting: Internal Medicine

## 2012-04-21 VITALS — BP 150/82 | HR 63 | Temp 97.8°F | Ht 64.0 in | Wt 250.0 lb

## 2012-04-21 MED ORDER — BUDESONIDE-FORMOTEROL FUMARATE 80-4.5 MCG/ACT IN AERO: 2.0000 | INHALATION_SPRAY | Freq: Two times a day (BID) | RESPIRATORY_TRACT | Status: DC

## 2012-04-21 NOTE — Progress Notes (Signed)
Subjective:    Patient ID: Roy Kane, male    DOB: 1932/04/30, 79 y.o.   MRN: [id number]  HPI PCP [name], PA Cardiologist is Dr. [name] -  Nephrologist is Dr. [name] at [facility]   Body mass index is 44.62 kg/(m^2).  reports that he quit smoking about 32 years ago. His smoking use included Cigarettes. He has a 37.5 pack-year smoking history. He does not have any smokeless tobacco history on file.  IOV 04/08/2012 Referred for question of elevated right hemidiaphragm and dyspnea  79 year old morbidly obese male accompanied by his wife.  - Reports dyspnea on exertion. Insidious onset. Dyspneic for many many years. He does not recollect onset. For the last few years or more precisely 6 years it is progressive following extensive abdominal wall hernia repair with status post large mesh placement at [facility] per his history. He quit smoking 30 years ago. This has currently brought on by simple activities such as went from room to room or bending too tie shoe lace. Dyspnea is relieved by rest and sitting. He does use oxygen at night but does not seem to help. There is no associated cough or chest pain no wheezing.  -He recollects pulmonary function testing in year 2000 in [place] and was told that his lung was "down". He does not recollect anyone mentioning to him about right hemidiaphragm paralysis. Most recently a few months ago and he was in [place] visiting his daughter he tripped and fell and fractured his left ribs. He returned to [facility] to get worked up. He had a chest x-ray that resulted in a CT scan. 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 [name] is mild increased interstitial markings within the left lung base the CT was in interpreted by [name]. He is only now aware of the elevated hemidiaphragm but review of her  records show that he had it on chest x-rays within our system even back in 2011.  - He denies diagnosis of COPD though he was a 40 pack smoker quitting 30 years ago  - He is morbidly obese with a BMI of 44.6. His only weight gain was 50 pounds 10 years ago following left knee replacement. Since then weight stable. He has difficulty controlling his weight and losing it. Apparently he is on a renal diet and therefore he has consume high carbohydrate foods which makes it difficult for him to lose weight. He is open to the idea of seeing Dr. [name] at [facility] endocrinology clinic  - He has a history of multiple abdominal wall hernias with a protuberant abdomen. 6 years ago he had a mesh placed  - Denies occupational exposure and pulmonary embolism  Please have sniff test by radiology  Please have full pulmonary function test  Please bring the CD-ROM of the CT scan done on December 20 13,013 at on [facility]  Do walk test in the office today or at followup  [name] all this by first week of March 2014 and return to me for followup  OV 04/21/2012  Returns for followup to reviewtest results  - History of sniff test that was surprisingly normal on 04/13/2012  - pulmonary function test that showed a t reduced diffusion capacity of 36% but otherwise was equivocal spirometry lung volumes  - He brought his outside CT scan CD-ROM with him off the  chest from December 2013: To me the lung fields look clear without any infiltrates of fibrosis or nodules. Official report is not available. Certainly he has high right hemidiaphragm.  - He is interested in weight loss. I gather that Dr. [name] at [facility] endocrinology could not see him because patient does not have hyperlipidemia. Patient has appointment with another endocrinologist at [facility] but I realize today that he has an endocrinologist at [facility] and he is really not interested in another endocrinology appointment.   - He also tells me  today that in the event of pus decided to do a pulmonary stress test that he will not be able to do it on a bike or treadmill thus ruling out the possibility that became ever do this test  - There are no new issues Review of Systems  Constitutional: Negative for fever and unexpected weight change.  HENT: Negative for ear pain, nosebleeds, congestion, sore throat, rhinorrhea, sneezing, trouble swallowing, dental problem, postnasal drip and sinus pressure.   Eyes: Negative for redness and itching.  Respiratory: Negative for cough, chest tightness, shortness of breath and wheezing.   Cardiovascular: Negative for palpitations and leg swelling.  Gastrointestinal: Negative for nausea and vomiting.  Genitourinary: Negative for dysuria.  Musculoskeletal: Negative for joint swelling.  Skin: Negative for rash.  Neurological: Negative for headaches.  Hematological: Does not bruise/bleed easily.  Psychiatric/Behavioral: Negative for dysphoric mood. The patient is not nervous/anxious.   Past, Family, Social reviewed: no change since last visit      Objective:   Physical Exam Nursing note and vitals reviewed. Constitutional: He is oriented to person, place, and time. He appears well-developed and well-nourished. No distress.  Seurological: He is alert and oriented to person, place, and time. He has normal reflexes. No cranial nerve deficit. Coordination normal.  Joking with good sense of humor  Skin: Skin is warm and dry. No rash noted. He is not diaphoretic. No erythema. No pallor.  Psychiatric: He has a normal mood and affect. His behavior is normal. Judgment and thought content normal.     Discussion only visit      Assessment & Plan:

## 2012-04-21 NOTE — Assessment & Plan Note (Addendum)
Tough situation to sort out his dyspnea. I think his morbid obesity and is elevated right hemidiaphragm even though it is not paralyzed and sniff test are constituting to his dyspnea. Obesity and a hemidiaphragm make the interpretation of pulmonary function test very difficult. He is low diffusion capacity but I do not see any obvious pulmonary fibrosis or interstitial lung disease in the CT scan of the chest from December 2013. There is no obvious emphysema either. He says because of his knees he cannot do pulmonary stress test. We are limited in working this dyspnea further  Plan - He can cancel his [facility] endocrinology appointment. I've given him identification on Dr. Verdia Kuba at [facility] lifestyle clinic. He will research this and if he is interested I will refer him that for weight loss - Explained that weight loss is the most crucial thing - Meanwhile I've given him some sample of inhaled corticosteroid and long-acting beta beta agonist [Symbicort] for empiric treatment; caution with the rare event of hyperglycemia advised and he will check his sugars periodically - I will I will see him back and of April 2014 after trip to [place] - Meanwhile I wait for his echocardiogram report  (> 50% of this 15 min visit spent in face to face counseling)

## 2012-04-21 NOTE — Patient Instructions (Addendum)
Please start symbicort 80/4.5 2 puff twice daily - take sample, script and show technique Call in 2-3 weeks to see if this helping you I will watch for your echo report Followup in 8 weeks (after return from trip to [place])

## 2012-04-22 ENCOUNTER — Encounter (HOSPITAL_COMMUNITY): Payer: Self-pay | Admitting: Internal Medicine

## 2012-04-22 ENCOUNTER — Encounter (HOSPITAL_COMMUNITY): Payer: Self-pay | Admitting: Cardiovascular Disease

## 2012-04-22 ENCOUNTER — Ambulatory Visit (HOSPITAL_COMMUNITY): Payer: Medicare Other | Attending: Cardiology | Admitting: Radiology

## 2012-04-22 DIAGNOSIS — I10 Essential (primary) hypertension: Secondary | ICD-10-CM | POA: Insufficient documentation

## 2012-04-22 NOTE — Progress Notes (Signed)
Echocardiogram performed.  

## 2012-04-22 NOTE — Telephone Encounter (Signed)
Pt seen yesterday to discuss. Carron Curie, CMA

## 2012-05-03 ENCOUNTER — Telehealth: Payer: Self-pay | Admitting: Internal Medicine

## 2012-05-03 ENCOUNTER — Other Ambulatory Visit: Payer: Self-pay | Admitting: Internal Medicine

## 2012-05-03 NOTE — Telephone Encounter (Signed)
noited he refused ono. Will walk him for desat when he follows up   Dr. Kalman Shan, M.D., [facility].C.P Pulmonary and Critical Care Medicine Staff Physician [facility] [place] [facility] Pager: [phone], If no answer or between  15:00h - 7:00h: call [phone]  05/03/2012 10:24 PM

## 2012-06-16 ENCOUNTER — Telehealth: Payer: Self-pay | Admitting: Cardiovascular Disease

## 2012-06-16 NOTE — Telephone Encounter (Signed)
Wants an appt, sent to scheduling.

## 2012-06-16 NOTE — Telephone Encounter (Signed)
New problem     Patient calling stating someone called him today. Please advise.

## 2012-06-24 ENCOUNTER — Telehealth: Payer: Self-pay | Admitting: Internal Medicine

## 2012-06-24 NOTE — Telephone Encounter (Signed)
Called and spoke to pt on 06/24/12 to make next ov per recall.  He stated he did not want appt. Roy Kane

## 2012-08-14 ENCOUNTER — Encounter: Payer: Self-pay | Admitting: Cardiovascular Disease

## 2012-08-14 ENCOUNTER — Ambulatory Visit (INDEPENDENT_AMBULATORY_CARE_PROVIDER_SITE_OTHER): Payer: Medicare Other | Admitting: Cardiovascular Disease

## 2012-08-14 VITALS — BP 156/80 | HR 58 | Wt 247.8 lb

## 2012-08-14 DIAGNOSIS — I5032 Chronic diastolic (congestive) heart failure: Secondary | ICD-10-CM

## 2012-08-14 DIAGNOSIS — I519 Heart disease, unspecified: Secondary | ICD-10-CM

## 2012-08-14 DIAGNOSIS — I5189 Other ill-defined heart diseases: Secondary | ICD-10-CM

## 2012-08-14 DIAGNOSIS — E785 Hyperlipidemia, unspecified: Secondary | ICD-10-CM

## 2012-08-14 DIAGNOSIS — I10 Essential (primary) hypertension: Secondary | ICD-10-CM

## 2012-08-14 LAB — BASIC METABOLIC PANEL
BUN: 44 mg/dL — ABNORMAL HIGH (ref 6–23)
CO2: 27 mEq/L (ref 19–32)
Calcium: 9.6 mg/dL (ref 8.4–10.5)
Creatinine, Ser: 2.1 mg/dL — ABNORMAL HIGH (ref 0.4–1.5)
GFR: 32.82 mL/min — ABNORMAL LOW (ref >60.00)
Glucose, Bld: 158 mg/dL — ABNORMAL HIGH (ref 70–99)
Sodium: 141 mEq/L (ref 135–145)

## 2012-08-14 LAB — HEPATIC FUNCTION PANEL
ALT: 18 U/L (ref 0–53)
Alkaline Phosphatase: 48 U/L (ref 39–117)
Bilirubin, Direct: 0 mg/dL (ref 0.0–0.3)
Total Bilirubin: 0.3 mg/dL (ref 0.3–1.2)
Total Protein: 6.5 g/dL (ref 6.0–8.3)

## 2012-08-14 LAB — LIPID PANEL
HDL: 52.3 mg/dL (ref >39.00)
Total CHOL/HDL Ratio: 3
Triglycerides: 203 mg/dL — ABNORMAL HIGH (ref 0.0–149.0)

## 2012-08-14 MED ORDER — SIMVASTATIN 40 MG PO TABS: 40.0000 mg | ORAL_TABLET | Freq: Every evening | ORAL | Status: DC

## 2012-08-14 NOTE — Progress Notes (Signed)
Roy Kane Date of Birth  March 10, 1932       [facility] Office [address]  [address] [place], [place]  [address]   [place], [place]  [address] [id number]     [phone]   Fax  [phone]    Fax [phone]  Problem List: 1. Diastolic congestive heart failure 2. Obesity 3. Renal cell carcinoma-status post nephrectomy 4. Diabetes mellitus 5. Hypertension  History of Present Illness:  Roy Kane has been having right arm and elbow pain. He has had some dizziness - especially in the morning, ( orthostatic hypotension).  After he's had breakfast and has had something to drink he feels better. He feels quite well through the day.  He has not been exercising. He is fatigued and short of breath with any sort of exercise. He does not have any episodes of chest pain.  Oct. 8, 2013 Roy Kane is still having some dyspnea.  He also has had a cough  - lasts all day and night.  He is on Lisinopril.  He still has some dizziness - doesn't feel comfortable with walking and as a result he is not exercising.  He thinks this is at least partially due to his loss of eyesite and lack of peripheral vision.  February 27, 2012: He has had a chronic cough.  We DCd his ACE inhibitor and started Losartan but this did not help.  He had a lung CT which revealed significant opacification on the left side of his lung.  August 14, 2012:  Roy Kane is doing OK.  His breathing is OK.   Current Outpatient Prescriptions on File Prior to Visit  Medication Sig Dispense Refill  . calcitRIOL (ROCALTROL) 0.5 MCG capsule Take 0.5 mcg by mouth daily.      . Calcium Carbonate Antacid (TUMS PO) Take by mouth daily.       . carvedilol (COREG) 25 MG tablet Take 1 tablet (25 mg total) by mouth 2 (two) times daily with a meal.  180 tablet  1  . doxazosin (CARDURA) 4 MG tablet Take 1 tablet (4 mg total) by mouth at bedtime.  90 tablet  3  . furosemide (LASIX) 20 MG tablet  Take 60 mg by mouth daily.      . insulin aspart (NOVOLOG) 100 UNIT/ML injection Inject into the skin 3 (three) times daily before meals. Sliding Scale      . Insulin Glargine (LANTUS [place]) Inject 20 Units into the skin daily.      Marland Kitchen losartan (COZAAR) 50 MG tablet Take 1 tablet by mouth daily.      . Multiple Vitamin (MULTIVITAMIN) tablet Take 1 tablet by mouth daily.         No current facility-administered medications on file prior to visit.    Allergies  Allergen Reactions  . Lisinopril Cough  . Neomycin-Polymyxin-Dexameth     REACTION: eye ointment caused eye swelling    Past Medical History  Diagnosis Date  . Obesity, morbid   . Diastolic dysfunction     CHRONIC  . Renal cell carcinoma   . S/p nephrectomy   . Hyperkalemia   . Hypertension   . Eye infection     CHRONIC LEFT EYE  . Diabetes mellitus   . Venous insufficiency     Past Surgical History  Procedure Laterality Date  . Total knee arthroplasty    . Hernia repair      BILATERAL  .  Prostate surgery    . Gallbladder surgery      History  Smoking status  . Former Smoker -- 1.50 packs/day for 25 years  . Types: Cigarettes  . Quit date: 02/19/1980  Smokeless tobacco  . Not on file    History  Alcohol Use No    Family History  Problem Relation Age of Onset  . Heart disease Father     CHF  . Cancer Paternal Grandmother     Reviw of Systems:  Reviewed in the HPI.  All other systems are negative.  Physical Exam: Blood pressure 156/80, pulse 58, weight 247 lb 12.8 oz (112.401 kg). General: Well developed, well nourished, in no acute distress.  Head: Normocephalic, atraumatic, sclera non-icteric, mucus membranes are moist,   Neck: Supple. Carotids are 2 + without bruits. No JVD  Lungs: Clear bilaterally to auscultation.  Heart: regular rate.  normal  S1 S2. No murmurs, gallops or rubs.  Abdomen: Soft, non-tender, non-distended with normal bowel sounds. No hepatomegaly. No rebound/guarding. No  masses.  Msk:  Strength and tone are normal  Extremities: No clubbing or cyanosis. 1+2 edema in left leg. Trace on right..  Distal pedal pulses are 1+ and equal bilaterally.  Neuro: Alert and oriented X 3. Moves all extremities spontaneously.  Psych:  Responds to questions appropriately with a normal affect.  ECG:  August 14, 2012:  Sinus brady at 75. Otherwise normal ECG Assessment / Plan:

## 2012-08-14 NOTE — Assessment & Plan Note (Signed)
Roy Kane is doing OK.  His BP remains mildly elevated but his readings are OK most of the time.  He needs to exercise and watch his diet.

## 2012-08-14 NOTE — Assessment & Plan Note (Signed)
He has normal left ventricle systolic function. He does have some diastolic dysfunction. This is complicated by his chronic renal insufficiency. He also has significant obesity. We'll continue with his same medications.

## 2012-08-14 NOTE — Patient Instructions (Signed)
Your physician wants you to follow-up in: 6 months  You will receive a reminder letter in the mail two months in advance. If you don't receive a letter, please call our office to schedule the follow-up appointment.   Your physician recommends that you return for a FASTING lipid profile: today   Your physician recommends that you continue on your current medications as directed. Please refer to the Current Medication list given to you today.

## 2012-08-18 ENCOUNTER — Ambulatory Visit: Payer: Medicare Other | Admitting: Cardiovascular Disease

## 2012-11-18 ENCOUNTER — Other Ambulatory Visit: Payer: Self-pay | Admitting: Cardiovascular Disease

## 2013-02-22 ENCOUNTER — Other Ambulatory Visit: Payer: Self-pay | Admitting: Cardiovascular Disease

## 2013-03-12 ENCOUNTER — Other Ambulatory Visit: Payer: Self-pay | Admitting: Cardiovascular Disease

## 2013-03-18 ENCOUNTER — Other Ambulatory Visit: Payer: Self-pay | Admitting: Cardiovascular Disease

## 2013-03-19 ENCOUNTER — Other Ambulatory Visit: Payer: Self-pay | Admitting: *Deleted

## 2013-03-19 MED ORDER — SIMVASTATIN 40 MG PO TABS: ORAL_TABLET | ORAL | Status: DC

## 2013-04-09 ENCOUNTER — Other Ambulatory Visit: Payer: Self-pay | Admitting: Cardiovascular Disease

## 2013-04-26 ENCOUNTER — Ambulatory Visit: Payer: Medicare Other | Admitting: Cardiovascular Disease

## 2013-05-14 ENCOUNTER — Ambulatory Visit (INDEPENDENT_AMBULATORY_CARE_PROVIDER_SITE_OTHER): Payer: Medicare Other | Admitting: Cardiovascular Disease

## 2013-05-14 ENCOUNTER — Encounter: Payer: Self-pay | Admitting: Cardiovascular Disease

## 2013-05-14 VITALS — BP 144/70 | HR 51 | Ht 64.0 in | Wt 248.6 lb

## 2013-05-14 DIAGNOSIS — I10 Essential (primary) hypertension: Secondary | ICD-10-CM

## 2013-05-14 DIAGNOSIS — I519 Heart disease, unspecified: Secondary | ICD-10-CM

## 2013-05-14 DIAGNOSIS — I5189 Other ill-defined heart diseases: Secondary | ICD-10-CM

## 2013-05-14 DIAGNOSIS — I872 Venous insufficiency (chronic) (peripheral): Secondary | ICD-10-CM

## 2013-05-14 LAB — HEPATIC FUNCTION PANEL
ALT: 24 U/L (ref 0–53)
AST: 25 U/L (ref 0–37)
Albumin: 3.7 g/dL (ref 3.5–5.2)
Alkaline Phosphatase: 58 U/L (ref 39–117)
Bilirubin, Direct: 0.2 mg/dL (ref 0.0–0.3)
Total Bilirubin: 0.7 mg/dL (ref 0.3–1.2)
Total Protein: 6.4 g/dL (ref 6.0–8.3)

## 2013-05-14 LAB — LIPID PANEL
CHOL/HDL RATIO: 3
Cholesterol: 157 mg/dL (ref 0–200)
HDL: 51.8 mg/dL (ref >39.00)
LDL CALC: 65 mg/dL (ref 0–99)
Triglycerides: 203 mg/dL — ABNORMAL HIGH (ref 0.0–149.0)
VLDL: 40.6 mg/dL — ABNORMAL HIGH (ref 0.0–40.0)

## 2013-05-14 LAB — TSH: TSH: 0.84 u[IU]/mL (ref 0.35–5.50)

## 2013-05-14 NOTE — Patient Instructions (Addendum)
Schedule Bilateral Venous Dopplers   Lab work today ( Bmet,lipids,hepatic,tsh )  Your physician has requested that you have an echocardiogram. Echocardiography is a painless test that uses sound waves to create images of your heart. It provides your doctor with information about the size and shape of your heart and how well your heart's chambers and valves are working. This procedure takes approximately one hour. There are no restrictions for this procedure.  Your physician recommends that you schedule a follow-up appointment in: 3 months.

## 2013-05-14 NOTE — Progress Notes (Signed)
Roy RubensYigael Kane Date of Birth  1932-05-06       [facility]    [place] Office [address]  [address] [place], [place]  [id number]   [place], [place]  [id number] [phone]     [phone]   Fax  [phone]    Fax [phone]  Problem List: 1. Diastolic congestive heart failure 2. Obesity 3. Renal cell carcinoma-status post nephrectomy 4. Diabetes mellitus 5. Hypertension  History of Present Illness:  Roy Kane has been having right arm and elbow pain. He has had some dizziness - especially in the morning, ( orthostatic hypotension).  After he's had breakfast and has had something to drink he feels better. He feels quite well through the day.  He has not been exercising. He is fatigued and short of breath with any sort of exercise. He does not have any episodes of chest pain.  Oct. 8, 2013 Roy Nielsen- Pietro is still having some dyspnea.  He also has had a cough  - lasts all day and night.  He is on Lisinopril.  He still has some dizziness - doesn't feel comfortable with walking and as a result he is not exercising.  He thinks this is at least partially due to his loss of eyesite and lack of peripheral vision.  February 27, 2012: He has had a chronic cough.  We DCd his ACE inhibitor and started Losartan but this did not help.  He had a lung CT which revealed significant opacification on the left side of his lung.  August 14, 2012:  Roy Kane is doing OK.  His breathing is OK.   May 14, 2012:   Roy Kane is upset today because he was never called or notified about his appointment.  He is not doing well.  Has a chronic cough.  Especially at night.  Productive of white sputum.  Also is very short of breath. His left leg is  very edematous now.   He fell on his left side/ leg  and now has profound left leg edema.  He has seen ortho - was told   He went to visit his son in [place] ( [place] in elevation ) had significant dyspnea.    Current Outpatient  Prescriptions on File Prior to Visit  Medication Sig Dispense Refill  . calcitRIOL (ROCALTROL) 0.5 MCG capsule Take 0.5 mcg by mouth daily.      . Calcium Carbonate Antacid (TUMS PO) Take by mouth daily.       . carvedilol (COREG) 25 MG tablet Take 1 tablet (25 mg total) by mouth 2 (two) times daily with a meal.  180 tablet  1  . doxazosin (CARDURA) 4 MG tablet TAKE 1 TABLET BY MOUTH EVERY NIGHT AT BEDTIME  90 tablet  0  . furosemide (LASIX) 20 MG tablet Take 60 mg by mouth daily.      . insulin aspart (NOVOLOG) 100 UNIT/ML injection Inject into the skin 3 (three) times daily before meals. Sliding Scale      . Insulin Glargine (LANTUS [place]) Inject 20 Units into the skin daily.      Marland Kitchen. losartan (COZAAR) 50 MG tablet Take 1 tablet by mouth daily.      Marland Kitchen. losartan (COZAAR) 50 MG tablet TAKE 1 TABLET BY MOUTH EVERY DAY  90 tablet  0  . Multiple Vitamin (MULTIVITAMIN) tablet Take 1 tablet by mouth daily.        . simvastatin (ZOCOR) 40 MG tablet TAKE  1 TABLET BY MOUTH EVERY EVENING  30 tablet  0   No current facility-administered medications on file prior to visit.    Allergies  Allergen Reactions  . Lisinopril Cough  . Neomycin-Polymyxin-Dexameth     REACTION: eye ointment caused eye swelling    Past Medical History  Diagnosis Date  . Obesity, morbid   . Diastolic dysfunction     CHRONIC  . Renal cell carcinoma   . S/p nephrectomy   . Hyperkalemia   . Hypertension   . Eye infection     CHRONIC LEFT EYE  . Diabetes mellitus   . Venous insufficiency     Past Surgical History  Procedure Laterality Date  . Total knee arthroplasty    . Hernia repair      BILATERAL  . Prostate surgery    . Gallbladder surgery      History  Smoking status  . Former Smoker -- 1.50 packs/day for 25 years  . Types: Cigarettes  . Quit date: 02/19/1980  Smokeless tobacco  . Not on file    History  Alcohol Use No    Family History  Problem Relation Age of Onset  . Heart disease Father      CHF  . Cancer Paternal Grandmother     Reviw of Systems:  Reviewed in the HPI.  All other systems are negative.  Physical Exam: Blood pressure 144/70, pulse 51, height 5\' 4"  (1.626 m), weight 248 lb 9.6 oz (112.764 kg). General: Well developed, well nourished, in no acute distress.  Head: Normocephalic, atraumatic, sclera non-icteric, mucus membranes are moist,   Neck: Supple. Carotids are 2 + without bruits. No JVD  Lungs: Clear bilaterally to auscultation.  Heart: regular rate.  normal  S1 S2. No murmurs, gallops or rubs.  Abdomen: Soft, non-tender, non-distended with normal bowel sounds. No hepatomegaly. No rebound/guarding. No masses.  Msk:  Strength and tone are normal  Extremities: No clubbing or cyanosis. 1+2 edema in left leg. Trace on right..  Distal pedal pulses are 1+ and equal bilaterally.  Neuro: Alert and oriented X 3. Moves all extremities spontaneously.  Psych:  Responds to questions appropriately with a normal affect.  ECG:  05/14/2048: Sinus bradycardia 51 beats a minute. Otherwise the EKG is normal.  Assessment / Plan:

## 2013-05-14 NOTE — Assessment & Plan Note (Signed)
Roy NielsenYigael continues to have dyspnea.  He has seen pulmonary (Ramaswami).  He has a chronic cough - productive of white sputum.  PFTs show severe diffusion deficit.  By my interpretation, he also has significant restrictive lung disease.    He has normal LV systolic function but has had evidence of diastolic dysfunction in the past.  His degree of dyspnea seems to be out of proportion to his diastolic dysfunction.    I would like for him to double his lasix for the next 3 days.  His creatinine is 2.0 ( he is s/p nephrectomy) so he is not on potassium replacement. We will have him call on Monday or Tuesday to see if he's feeling any better. If he is feeling better after this diuresis then we will continue with a more aggressive diuresis. If this extra Lasix did not help and then we'll discuss issues with pulmonology. I'll try to reach Roy Kane for further discussion and explanation of his pulmonary function test.  He's had significant edema of his left leg. This is been present for the past year so. I cannot find any evidence of a venous duplex scan. We'll obtain a bilateral venous duplex scan dementia that he doesn't have deep vein thrombosis.  In addition, a lot of his symptoms could be explained by sleep apnea. His wife states that he snores. We'll go sleep study for further evaluation of the possibility of sleep apnea. We'll check labs today including a basic metabolic profile, lipid profile, and liver enzymes. We'll also get a TSH.

## 2013-05-17 ENCOUNTER — Other Ambulatory Visit: Payer: Self-pay

## 2013-05-17 ENCOUNTER — Encounter: Payer: Self-pay | Admitting: Cardiovascular Disease

## 2013-05-17 DIAGNOSIS — I509 Heart failure, unspecified: Secondary | ICD-10-CM

## 2013-05-17 MED ORDER — CARVEDILOL 25 MG PO TABS: 25.0000 mg | ORAL_TABLET | Freq: Two times a day (BID) | ORAL | Status: DC

## 2013-05-17 NOTE — Telephone Encounter (Signed)
Patient called 05/14/13 lab results given.Patient stated he was unable to increase Lasix for 3 days.Stated caused cramping in lower legs.Stated he is taking Lasix 60 mg daily.

## 2013-05-18 ENCOUNTER — Telehealth: Payer: Self-pay | Admitting: *Deleted

## 2013-05-18 ENCOUNTER — Telehealth: Payer: Self-pay | Admitting: Internal Medicine

## 2013-05-18 DIAGNOSIS — Q791 Other congenital malformations of diaphragm: Secondary | ICD-10-CM

## 2013-05-18 NOTE — Telephone Encounter (Signed)
Dr. Colletta Marylandamaswami is not in today.  He will be working [employer].  I will continue to try to reach him.  I have asked Dr. Sherene SiresWert ( who intrepreted the PFTs) to call me and am still waiting to hear back.

## 2013-05-18 NOTE — Telephone Encounter (Signed)
Pt took higher dose of lasix x 3 days. C/o cramps with higher dose. Pt states he had no change in his breathing. Pt was told that you will be contacting Dr Colletta Marylandamaswami  Also for him to call and make an app with him. Pt agreed to plan.

## 2013-05-18 NOTE — Telephone Encounter (Signed)
Msg forwarded to MW to call Dr Elease HashimotoNahser

## 2013-05-18 NOTE — Telephone Encounter (Signed)
-  Eventration by CT chest 02/10/12  -Sniff test neg 2/03/24/12  Therefore explains restrictive pfts esp in obese pt.  Although the diaphragm is not paralyzed, it is dysfunction and lung volumes all the more impacted by abd compartment

## 2013-05-19 NOTE — Telephone Encounter (Signed)
Per Dr Nahser/ he spoke with Dr Sherene SiresWert, pt needs to lose weight.  Pt was called and informed/ he verbalized understanding.

## 2013-05-20 NOTE — Telephone Encounter (Signed)
Yes, ok to close

## 2013-05-20 NOTE — Telephone Encounter (Signed)
MW, did you speak with Melburn Popperasher and can this message be closed? Thanks.

## 2013-05-21 ENCOUNTER — Other Ambulatory Visit: Payer: Self-pay | Admitting: Cardiovascular Disease

## 2013-05-26 ENCOUNTER — Other Ambulatory Visit (HOSPITAL_COMMUNITY): Payer: Self-pay | Admitting: Cardiology

## 2013-05-26 DIAGNOSIS — R609 Edema, unspecified: Secondary | ICD-10-CM

## 2013-05-27 ENCOUNTER — Ambulatory Visit (HOSPITAL_COMMUNITY): Payer: Medicare Other | Attending: Cardiology | Admitting: Cardiology

## 2013-05-27 ENCOUNTER — Ambulatory Visit (HOSPITAL_COMMUNITY): Payer: Medicare Other | Attending: Cardiology | Admitting: Radiology

## 2013-05-27 ENCOUNTER — Encounter: Payer: Self-pay | Admitting: Cardiology

## 2013-05-27 DIAGNOSIS — I872 Venous insufficiency (chronic) (peripheral): Secondary | ICD-10-CM

## 2013-05-27 DIAGNOSIS — R609 Edema, unspecified: Secondary | ICD-10-CM

## 2013-05-27 DIAGNOSIS — R072 Precordial pain: Secondary | ICD-10-CM

## 2013-05-27 DIAGNOSIS — I10 Essential (primary) hypertension: Secondary | ICD-10-CM | POA: Insufficient documentation

## 2013-05-27 DIAGNOSIS — I519 Heart disease, unspecified: Secondary | ICD-10-CM | POA: Insufficient documentation

## 2013-05-27 DIAGNOSIS — I5189 Other ill-defined heart diseases: Secondary | ICD-10-CM

## 2013-05-27 DIAGNOSIS — R0600 Dyspnea, unspecified: Secondary | ICD-10-CM

## 2013-05-27 DIAGNOSIS — R0609 Other forms of dyspnea: Secondary | ICD-10-CM | POA: Insufficient documentation

## 2013-05-27 DIAGNOSIS — R0689 Other abnormalities of breathing: Secondary | ICD-10-CM

## 2013-05-27 DIAGNOSIS — I999 Unspecified disorder of circulatory system: Secondary | ICD-10-CM | POA: Insufficient documentation

## 2013-05-27 DIAGNOSIS — R0989 Other specified symptoms and signs involving the circulatory and respiratory systems: Secondary | ICD-10-CM | POA: Insufficient documentation

## 2013-05-27 NOTE — Progress Notes (Signed)
Bilateral lower extremity venous duplex performed 

## 2013-05-27 NOTE — Progress Notes (Signed)
Echocardiogram Performed. 

## 2013-06-22 ENCOUNTER — Encounter (HOSPITAL_BASED_OUTPATIENT_CLINIC_OR_DEPARTMENT_OTHER): Payer: Medicare Other

## 2013-07-06 ENCOUNTER — Other Ambulatory Visit: Payer: Self-pay | Admitting: Cardiovascular Disease

## 2013-07-10 ENCOUNTER — Other Ambulatory Visit: Payer: Self-pay | Admitting: Cardiovascular Disease

## 2013-07-29 ENCOUNTER — Ambulatory Visit (INDEPENDENT_AMBULATORY_CARE_PROVIDER_SITE_OTHER): Payer: Medicare Other | Admitting: Cardiovascular Disease

## 2013-07-29 ENCOUNTER — Encounter: Payer: Self-pay | Admitting: Cardiovascular Disease

## 2013-07-29 VITALS — BP 160/82 | HR 62 | Ht 64.0 in | Wt 242.0 lb

## 2013-07-29 DIAGNOSIS — I10 Essential (primary) hypertension: Secondary | ICD-10-CM

## 2013-07-29 DIAGNOSIS — I519 Heart disease, unspecified: Secondary | ICD-10-CM

## 2013-07-29 DIAGNOSIS — I5189 Other ill-defined heart diseases: Secondary | ICD-10-CM

## 2013-07-29 NOTE — Assessment & Plan Note (Signed)
His blood pressure has been fairly well controlled by today he is under quite a bit of stress and his blood pressure is higher. He informs me that he has a dry hacking cough. There is a chance that the losartan could be contributing to this. We'll have him discontinue the losartan. He'll keep track of his blood pressures and calls back with some readings in the next several weeks.  I'll see him in 3 months for followup visit.

## 2013-07-29 NOTE — Assessment & Plan Note (Signed)
His chronic diastolic congestive heart failure. It seems to be fairly well controlled at this time. He also has chronic kidney disease and severe restrictive lung disease.

## 2013-07-29 NOTE — Patient Instructions (Signed)
Your physician has recommended you make the following change in your medication:  1) STOP Losartan  Monitor your blood pressure at home. Call the office in 2-3 weeks with your blood pressure readings. [phone]  Your physician recommends that you schedule a follow-up appointment in: 3 months    .

## 2013-07-29 NOTE — Progress Notes (Signed)
Lysle RubensYigael Brager Date of Birth  1933-02-18       [facility]    [place] [facility] [address], [address]  [address], [address] [place], [place]  [address]   [place], [place]  [id number] [phone]     [phone]   Fax  [phone]    Fax [phone]  Problem List: 1. Diastolic congestive heart failure 2. Obesity 3. Renal cell carcinoma-status post nephrectomy 4. Diabetes mellitus 5. Hypertension  History of Present Illness:  Lavetta NielsenYigael has been having right arm and elbow pain. He has had some dizziness - especially in the morning, ( orthostatic hypotension).  After he's had breakfast and has had something to drink he feels better. He feels quite well through the day.  He has not been exercising. He is fatigued and short of breath with any sort of exercise. He does not have any episodes of chest pain.  Oct. 8, 2013 [name]- Kahli is still having some dyspnea.  He also has had a cough  - lasts all day and night.  He is on Lisinopril.  He still has some dizziness - doesn't feel comfortable with walking and as a result he is not exercising.  He thinks this is at least partially due to his loss of eyesite and lack of peripheral vision.  February 27, 2012: He has had a chronic cough.  We DCd his ACE inhibitor and started Losartan but this did not help.  He had a lung CT which revealed significant opacification on the left side of his lung.  August 14, 2012:  Lavetta NielsenYigael is doing OK.  His breathing is OK.   May 14, 2012:   Lavetta NielsenYigael is upset today because he was never called or notified about his appointment.  He is not doing well.  Has a chronic cough.  Especially at night.  Productive of white sputum.  Also is very short of breath. His left leg is  very edematous now.   He fell on his left side/ leg  and now has profound left leg edema.  He has seen ortho - was told   He went to visit his son in [place] ( 10,000 feet in elevation ) had significant dyspnea.    July 29, 2013;  He  has persistent DOE.  He has severe restrictive lung disease.   Also has some dizziness.  He has lost some weight.   His BP has been ok.  His blood pressure today is fairly elevated. He's been under lots of stress. His wife is [place] in [place] using her brother who is dying. His blood pressure readings at home have been relatively well-controlled.  He has a chronic cough - productive of clear sputum.  No fever.    Current Outpatient Prescriptions on File Prior to Visit  Medication Sig Dispense Refill  . calcitRIOL (ROCALTROL) 0.5 MCG capsule Take 0.5 mcg by mouth daily.      . Calcium Carbonate Antacid (TUMS PO) Take by mouth daily.       . carvedilol (COREG) 25 MG tablet Take 1 tablet (25 mg total) by mouth 2 (two) times daily with a meal.  180 tablet  3  . doxazosin (CARDURA) 4 MG tablet TAKE 1 TABLET BY MOUTH EVERY NIGHT AT BEDTIME  90 tablet  0  . furosemide (LASIX) 20 MG tablet Take 60 mg by mouth daily.      . insulin aspart (NOVOLOG) 100 UNIT/ML injection Inject into the skin 3 (three) times daily  before meals. Sliding Scale      . Insulin Glargine (LANTUS [place]) Inject 20 Units into the skin daily.      Marland Kitchen losartan (COZAAR) 50 MG tablet TAKE 1 TABLET BY MOUTH EVERY DAY  90 tablet  0  . Multiple Vitamin (MULTIVITAMIN) tablet Take 1 tablet by mouth daily.        . simvastatin (ZOCOR) 40 MG tablet TAKE 1 TABLET BY MOUTH EVERY EVENING  30 tablet  1   No current facility-administered medications on file prior to visit.    Allergies  Allergen Reactions  . Lisinopril Cough  . Neomycin-Polymyxin-Dexameth     REACTION: eye ointment caused eye swelling    Past Medical History  Diagnosis Date  . Obesity, morbid   . Diastolic dysfunction     CHRONIC  . Renal cell carcinoma   . S/p nephrectomy   . Hyperkalemia   . Hypertension   . Eye infection     CHRONIC LEFT EYE  . Diabetes mellitus   . Venous insufficiency     Past Surgical History  Procedure Laterality Date  . Total knee  arthroplasty    . Hernia repair      BILATERAL  . Prostate surgery    . Gallbladder surgery      History  Smoking status  . Former Smoker -- 1.50 packs/day for 25 years  . Types: Cigarettes  . Quit date: 02/19/1980  Smokeless tobacco  . Not on file    History  Alcohol Use No    Family History  Problem Relation Age of Onset  . Heart disease Father     CHF  . Cancer Paternal Grandmother     Reviw of Systems:  Reviewed in the HPI.  All other systems are negative.  Physical Exam: Blood pressure 160/82, pulse 62, height 5\' 4"  (1.626 m), weight 242 lb (109.77 kg). General: Well developed, well nourished, in no acute distress.  Head: Normocephalic, atraumatic, sclera non-icteric, mucus membranes are moist,   Neck: Supple. Carotids are 2 + without bruits. No JVD  Lungs: Clear bilaterally to auscultation.  Heart: regular rate.  normal  S1 S2. No murmurs, gallops or rubs.  Abdomen: Soft, non-tender, non-distended with normal bowel sounds. No hepatomegaly. No rebound/guarding. No masses.  Msk:  Strength and tone are normal  Extremities: No clubbing or cyanosis. 1+2 edema in left leg. Trace on right..  Distal pedal pulses are 1+ and equal bilaterally.  Neuro: Alert and oriented X 3. Moves all extremities spontaneously.  Psych:  Responds to questions appropriately with a normal affect.  ECG:  05/14/2048: Sinus bradycardia 51 beats a minute. Otherwise the EKG is normal.  Assessment / Plan:

## 2013-08-15 ENCOUNTER — Other Ambulatory Visit: Payer: Self-pay | Admitting: Cardiovascular Disease

## 2013-08-19 ENCOUNTER — Other Ambulatory Visit: Payer: Self-pay | Admitting: Cardiovascular Disease

## 2013-09-12 ENCOUNTER — Other Ambulatory Visit: Payer: Self-pay

## 2013-09-12 MED ORDER — SIMVASTATIN 40 MG PO TABS: ORAL_TABLET | ORAL | Status: DC

## 2013-10-04 ENCOUNTER — Other Ambulatory Visit: Payer: Self-pay | Admitting: Cardiovascular Disease

## 2013-10-05 ENCOUNTER — Other Ambulatory Visit: Payer: Self-pay | Admitting: *Deleted

## 2013-10-05 MED ORDER — DOXAZOSIN MESYLATE 4 MG PO TABS: ORAL_TABLET | ORAL | Status: DC

## 2013-11-03 ENCOUNTER — Ambulatory Visit: Payer: Medicare Other | Admitting: Cardiovascular Disease

## 2013-11-22 ENCOUNTER — Encounter: Payer: Self-pay | Admitting: Cardiovascular Disease

## 2013-11-22 ENCOUNTER — Ambulatory Visit (INDEPENDENT_AMBULATORY_CARE_PROVIDER_SITE_OTHER): Payer: Medicare Other | Admitting: Cardiovascular Disease

## 2013-11-22 VITALS — BP 142/78 | HR 67 | Ht 64.0 in | Wt 244.0 lb

## 2013-11-22 DIAGNOSIS — I5032 Chronic diastolic (congestive) heart failure: Secondary | ICD-10-CM

## 2013-11-22 NOTE — Progress Notes (Signed)
Lysle RubensYigael Caudell Date of Birth  December 25, 1932       [facility]    [place] [facility] [address]  [address] [place], [place]  [address]   [place]  [id number] [phone]     [phone]   Fax  [phone]    Fax [phone]  Problem List: 1. Diastolic congestive heart failure 2. Obesity 3. Renal cell carcinoma-status post nephrectomy 4. Diabetes mellitus 5. Hypertension  History of Present Illness:  Roy Kane has been having right arm and elbow pain. He has had some dizziness - especially in the morning, ( orthostatic hypotension).  After he's had breakfast and has had something to drink he feels better. He feels quite well through the day.  He has not been exercising. He is fatigued and short of breath with any sort of exercise. He does not have any episodes of chest pain.  Oct. 8, 2013 Roy Nielsen- Januel is still having some dyspnea.  He also has had a cough  - lasts all day and night.  He is on Lisinopril.  He still has some dizziness - doesn't feel comfortable with walking and as a result he is not exercising.  He thinks this is at least partially due to his loss of eyesite and lack of peripheral vision.  February 27, 2012: He has had a chronic cough.  We DCd his ACE inhibitor and started Losartan but this did not help.  He had a lung CT which revealed significant opacification on the left side of his lung.  August 14, 2012:  Roy Kane is doing OK.  His breathing is OK.   May 14, 2012:   Roy Kane is upset today because he was never called or notified about his appointment.  He is not doing well.  Has a chronic cough.  Especially at night.  Productive of white sputum.  Also is very short of breath. His left leg is  very edematous now.   He fell on his left side/ leg  and now has profound left leg edema.  He has seen ortho - was told   He went to visit his son in [place] ( 10,000 feet in elevation ) had significant dyspnea.    July 29, 2013;  He  has persistent DOE.  He has severe restrictive lung disease.   Also has some dizziness.  He has lost some weight.   His BP has been ok.  His blood pressure today is fairly elevated. He's been under lots of stress. His wife is over in [place] using her brother who is dying. His blood pressure readings at home have been relatively well-controlled.  He has a chronic cough - productive of clear sputum.  No fever.   Oct. 5, 2015:  Roy Kane is about the same from a cardiac standpoint. He still has a chronic cough. [place]  had loss of shortness of breath - cabin was [place] 10[place]000 [place].     Current Outpatient Prescriptions on File Prior to Visit  Medication Sig Dispense Refill  . calcitRIOL (ROCALTROL) 0.5 MCG capsule Take 0.5 mcg by mouth daily.      . Calcium Carbonate Antacid (TUMS PO) Take by mouth daily.       . carvedilol (COREG) 25 MG tablet Take 1 tablet (25 mg total) by mouth 2 (two) times daily with a meal.  180 tablet  3  . doxazosin (CARDURA) 4 MG tablet TAKE 1 TABLET BY MOUTH EVERY  NIGHT AT BEDTIME  90 tablet  0  . furosemide (LASIX) 20 MG tablet Take 60 mg by mouth daily.      . insulin aspart (NOVOLOG) 100 UNIT/ML injection Inject into the skin 3 (three) times daily before meals. Sliding Scale      . Insulin Glargine (LANTUS [place]) Inject 20 Units into the skin daily.      . Multiple Vitamin (MULTIVITAMIN) tablet Take 1 tablet by mouth daily.        . simvastatin (ZOCOR) 40 MG tablet TAKE 1 TABLET BY MOUTH EVERY EVENING  30 tablet  6   No current facility-administered medications on file prior to visit.    Allergies  Allergen Reactions  . Lisinopril Cough  . Neomycin-Polymyxin-Dexameth     REACTION: eye ointment caused eye swelling  . Other     Other reaction(s): Other (See Comments) MAXITROL OINTMENT = UNKNOWN  . Sulfamethoxazole Other (See Comments)    UNKNOWN  . Tape Rash    tears skin - paper tape OK    Past Medical History  Diagnosis Date  . Obesity,  morbid   . Diastolic dysfunction     CHRONIC  . Renal cell carcinoma   . S/p nephrectomy   . Hyperkalemia   . Hypertension   . Eye infection     CHRONIC LEFT EYE  . Diabetes mellitus   . Venous insufficiency     Past Surgical History  Procedure Laterality Date  . Total knee arthroplasty    . Hernia repair      BILATERAL  . Prostate surgery    . Gallbladder surgery      History  Smoking status  . Former Smoker -- 1.50 packs/day for 25 years  . Types: Cigarettes  . Quit date: 02/19/1980  Smokeless tobacco  . Not on file    History  Alcohol Use No    Family History  Problem Relation Age of Onset  . Heart disease Father     CHF  . Cancer Paternal Grandmother     Reviw of Systems:  Reviewed in the HPI.  All other systems are negative.  Physical Exam: Blood pressure 142/78, pulse 67, height 5\' 4"  (1.626 m), weight 244 lb (110.678 kg). General: Well developed, well nourished, in no acute distress.  Head: Normocephalic, atraumatic, sclera non-icteric, mucus membranes are moist,   Neck: Supple. Carotids are 2 + without bruits. No JVD  Lungs: Clear bilaterally to auscultation.  Heart: regular rate.  normal  S1 S2. No murmurs, gallops or rubs.  Abdomen: Soft, non-tender, non-distended with normal bowel sounds. No hepatomegaly. No rebound/guarding. No masses.  Msk:  Strength and tone are normal  Extremities: No clubbing or cyanosis. 1+2 edema in left leg. Trace on right..  Distal pedal pulses are 1+ and equal bilaterally.  Neuro: Alert and oriented X 3. Moves all extremities spontaneously.  Psych:  Responds to questions appropriately with a normal affect.  ECG:  05/14/2048: Sinus bradycardia 51 beats a minute. Otherwise the EKG is normal.  Assessment / Plan:

## 2013-11-22 NOTE — Patient Instructions (Signed)
Your physician recommends that you continue on your current medications as directed. Please refer to the Current Medication list given to you today.  Your physician wants you to follow-up in: 6 months with Dr. Nahser.  You will receive a reminder letter in the mail two months in advance. If you don't receive a letter, please call our office to schedule the follow-up appointment.  

## 2013-11-22 NOTE — Assessment & Plan Note (Signed)
Roy Kane  seems to be feeling well. He's not had any episodes of chest pain. His breathing seems to be stable. His edema is fairly well controlled. I will see him again in 6 months for followup visit sooner if needed.

## 2013-11-26 ENCOUNTER — Encounter: Payer: Self-pay | Admitting: Cardiovascular Disease

## 2013-12-27 ENCOUNTER — Other Ambulatory Visit: Payer: Self-pay | Admitting: Cardiovascular Disease

## 2014-01-30 ENCOUNTER — Other Ambulatory Visit: Payer: Self-pay | Admitting: Cardiovascular Disease

## 2014-02-21 ENCOUNTER — Other Ambulatory Visit: Payer: Self-pay | Admitting: Cardiovascular Disease

## 2014-03-31 ENCOUNTER — Other Ambulatory Visit: Payer: Self-pay | Admitting: Cardiovascular Disease

## 2014-04-24 ENCOUNTER — Other Ambulatory Visit: Payer: Self-pay | Admitting: Cardiovascular Disease

## 2014-05-13 ENCOUNTER — Other Ambulatory Visit: Payer: Self-pay | Admitting: Cardiovascular Disease

## 2014-07-18 ENCOUNTER — Other Ambulatory Visit: Payer: Self-pay | Admitting: Cardiovascular Disease

## 2014-07-19 ENCOUNTER — Other Ambulatory Visit: Payer: Self-pay | Admitting: Cardiovascular Disease

## 2014-07-19 ENCOUNTER — Other Ambulatory Visit: Payer: Self-pay

## 2014-07-19 MED ORDER — SIMVASTATIN 40 MG PO TABS: 40.0000 mg | ORAL_TABLET | Freq: Every evening | ORAL | Status: DC

## 2014-07-20 ENCOUNTER — Other Ambulatory Visit: Payer: Self-pay | Admitting: Cardiovascular Disease

## 2014-07-21 NOTE — Telephone Encounter (Signed)
Per note 10.5.15

## 2014-08-07 ENCOUNTER — Other Ambulatory Visit: Payer: Self-pay | Admitting: Cardiovascular Disease

## 2014-08-08 ENCOUNTER — Other Ambulatory Visit: Payer: Self-pay

## 2014-08-08 MED ORDER — LOSARTAN POTASSIUM 50 MG PO TABS: 50.0000 mg | ORAL_TABLET | Freq: Every day | ORAL | Status: DC

## 2014-10-20 ENCOUNTER — Other Ambulatory Visit: Payer: Self-pay | Admitting: Cardiovascular Disease

## 2014-10-25 ENCOUNTER — Other Ambulatory Visit: Payer: Self-pay

## 2014-10-25 MED ORDER — CARVEDILOL 25 MG PO TABS: 25.0000 mg | ORAL_TABLET | Freq: Two times a day (BID) | ORAL | Status: DC

## 2014-11-02 ENCOUNTER — Other Ambulatory Visit: Payer: Self-pay | Admitting: Cardiovascular Disease

## 2014-11-02 NOTE — Telephone Encounter (Signed)
Medication was stopped 07/29/13, but has been refilled multiple times since then. I do not see where he was started back on it. Please advise. Thanks, MI

## 2014-11-02 NOTE — Telephone Encounter (Signed)
Must have appt for further refills.

## 2014-11-02 NOTE — Telephone Encounter (Signed)
He has CKD. DC Losartan .

## 2015-01-03 ENCOUNTER — Ambulatory Visit (INDEPENDENT_AMBULATORY_CARE_PROVIDER_SITE_OTHER): Payer: Medicare Other | Admitting: Cardiovascular Disease

## 2015-01-03 ENCOUNTER — Encounter: Payer: Self-pay | Admitting: Cardiovascular Disease

## 2015-01-03 VITALS — BP 106/68 | HR 70 | Ht 64.0 in | Wt 241.0 lb

## 2015-01-03 DIAGNOSIS — R0789 Other chest pain: Secondary | ICD-10-CM

## 2015-01-03 DIAGNOSIS — I10 Essential (primary) hypertension: Secondary | ICD-10-CM | POA: Diagnosis not present

## 2015-01-03 DIAGNOSIS — I5032 Chronic diastolic (congestive) heart failure: Secondary | ICD-10-CM | POA: Diagnosis not present

## 2015-01-03 NOTE — Progress Notes (Signed)
[name] Date of Birth  1932/06/11       [facility] Office [address], [address], [place], [place]  [address]   [place], [place]  [address] [phone]     [phone]   Fax  [phone]    Fax [phone]  Problem List: 1. Diastolic congestive heart failure 2. Obesity 3. Renal cell carcinoma-status post nephrectomy 4. Diabetes mellitus 5. Hypertension  History of Present Illness:  Roy Kane has been having right arm and elbow pain. He has had some dizziness - especially in the morning, ( orthostatic hypotension).  After he's had breakfast and has had something to drink he feels better. He feels quite well through the day.  He has not been exercising. He is fatigued and short of breath with any sort of exercise. He does not have any episodes of chest pain.  Oct. 8, 2013 [name] is still having some dyspnea.  He also has had a cough  - lasts all day and night.  He is on Lisinopril.  He still has some dizziness - doesn't feel comfortable with walking and as a result he is not exercising.  He thinks this is at least partially due to his loss of eyesite and lack of peripheral vision.  February 27, 2012: He has had a chronic cough.  We DCd his ACE inhibitor and started Losartan but this did not help.  He had a lung CT which revealed significant opacification on the left side of his lung.  August 14, 2012:  Amos is doing OK.  His breathing is OK.   May 14, 2012:   Albertus is upset today because he was never called or notified about his appointment.  He is not doing well.  Has a chronic cough.  Especially at night.  Productive of white sputum.  Also is very short of breath. His left leg is  very edematous now.   He fell on his left side/ leg  and now has profound left leg edema.  He has seen ortho - was told   He went to visit his son in [place] ( 10,000 feet in elevation ) had significant dyspnea.    July 29, 2013;  He  has persistent DOE.  He has severe restrictive lung disease.   Also has some dizziness.  He has lost some weight.   His BP has been ok.  His blood pressure today is fairly elevated. He's been under lots of stress. His wife is over in [place] using her brother who is dying. His blood pressure readings at home have been relatively well-controlled.  He has a chronic cough - productive of clear sputum.  No fever.   Oct. 5, 2015:  Aayush is about the same from a cardiac standpoint. He still has a chronic cough. He visited his son in [place]  had loss of shortness of breath - cabin [place]000 [place].     Nov. 15, 2016:  Has not been doing well Has had worsening shortness of breath.  Has had some dizziness,  Unsteadiness with walking  Some of his symptoms are c/w orthostasis. Friday , he was cooking.  Had some left sided arm  " electric feeling"  Associated with electric sensation in his left side of his chest  Lasted 15 - 20 min.   Associated with some shortness of breath . This is the 2nd time in the past several weeks.  Current Outpatient Prescriptions on File Prior to Visit  Medication Sig Dispense Refill  . Blood Glucose Monitoring Suppl (ONE TOUCH ULTRA SYSTEM KIT) W/DEVICE KIT Use as instructed    . calcitRIOL (ROCALTROL) 0.5 MCG capsule Take 0.5 mcg by mouth 2 (two) times a week. MONDAYS AND FRIDAYS    . Calcium Carbonate Antacid (TUMS PO) Take by mouth daily.     . carvedilol (COREG) 25 MG tablet Take 1 tablet (25 mg total) by mouth 2 (two) times daily with a meal. Patient must have appointment  before any more rx call be refill. 60 tablet 0  . doxazosin (CARDURA) 4 MG tablet TAKE 1 TABLET BY MOUTH EVERY NIGHT AT BEDTIME 90 tablet 3  . furosemide (LASIX) 20 MG tablet Take 60 mg by mouth daily.    . insulin aspart (NOVOLOG) 100 UNIT/ML injection Inject into the skin 3 (three) times daily before meals. Sliding Scale    . Insulin Glargine (LANTUS [place]) Inject 20 Units into the skin daily.     . Multiple Vitamin (MULTIVITAMIN) tablet Take 1 tablet by mouth daily.      . simvastatin (ZOCOR) 40 MG tablet TAKE 1 TABLET BY MOUTH EVERY EVENING. NEED OFFICE APPOINTMENT WITH MD 90 tablet 0   No current facility-administered medications on file prior to visit.    Allergies  Allergen Reactions  . Lisinopril Cough  . Neomycin-Polymyxin-Dexameth     REACTION: eye ointment caused eye swelling  . Other     Other reaction(s): Other (See Comments) MAXITROL OINTMENT = UNKNOWN  . Sulfamethoxazole Other (See Comments)    UNKNOWN  . Tape Rash    tears skin - paper tape OK    Past Medical History  Diagnosis Date  . Obesity, morbid ([place])   . Diastolic dysfunction     CHRONIC  . Renal cell carcinoma   . S/p nephrectomy   . Hyperkalemia   . Hypertension   . Eye infection     CHRONIC LEFT EYE  . Diabetes mellitus   . Venous insufficiency     Past Surgical History  Procedure Laterality Date  . Total knee arthroplasty    . Hernia repair      BILATERAL  . Prostate surgery    . Gallbladder surgery      History  Smoking status  . Former Smoker -- 1.50 packs/day for 25 years  . Types: Cigarettes  . Quit date: 02/19/1980  Smokeless tobacco  . Not on file    History  Alcohol Use No    Family History  Problem Relation Age of Onset  . Heart disease Father     CHF  . Cancer Paternal Grandmother     Reviw of Systems:  Reviewed in the HPI.  All other systems are negative.  Physical Exam: Blood pressure 106/68, pulse 70, height 5' 4"  (1.626 m), weight 241 lb (109.317 kg). General: Well developed, well nourished, in no acute distress.  Head: Normocephalic, atraumatic, sclera non-icteric, mucus membranes are moist,   Neck: Supple. Carotids are 2 + without bruits. No JVD  Lungs: Clear bilaterally to auscultation.  Heart: regular rate.  normal  S1 S2. No murmurs, gallops or rubs.  Abdomen: Soft, non-tender, non-distended with normal bowel sounds. No hepatomegaly. No  rebound/guarding. No masses.  Msk:  Strength and tone are normal  Extremities: No clubbing or cyanosis. 1+2 edema in left leg. Trace on right..  Distal pedal pulses are 1+ and equal bilaterally.  Neuro: Alert and oriented X 3.  Moves all extremities spontaneously.  Psych:  Responds to questions appropriately with a normal affect.  ECG:  01/03/2015: Normal sinus rhythm with sinus arrhythmia. His heart rate is 70. EKG is normal.  Assessment / Plan:   1. Diastolic congestive heart failure 2. Obesity 3. Renal cell carcinoma-status post nephrectomy 4. Diabetes mellitus 5. Hypertension 6.  Chest discomfort:  He had an electrical sensation in his left chest and his left arm. He did not go to the emergency room to be evaluated. We'll schedule him for a [employer] study. 7. Orthostatic hypotension: Patient has been having some dizzy episodes and falls when he stands up. We will discontinue the Doxasozin. . I'll see him again in 3 months for follow-up visit.  Yarelin Reichardt, Wonda Cheng, MD  01/03/2015 10:09 AM    [facility],  [place]  [address] Pager [phone] Phone: [phone]; Fax: [phone]   [facility]  [address] [place], [place]  [address] [phone]   Fax [phone]

## 2015-01-03 NOTE — Patient Instructions (Signed)
Medication Instructions:  STOP Cardura    Labwork: None Ordered   Testing/Procedures: Your physician has requested that you have a lexiscan myoview. For further information please visit [email or web address] Please follow instruction sheet, as given.    Follow-Up: Your physician recommends that you schedule a follow-up appointment in: 3 months with Dr. Elease HashimotoNahser.   If you need a refill on your cardiac medications before your next appointment, please call your pharmacy.   Thank you for choosing [facility]! Eligha BridegroomMichelle Swinyer, RN [phone]

## 2015-01-04 ENCOUNTER — Encounter: Payer: Self-pay | Admitting: Cardiovascular Disease

## 2015-01-09 ENCOUNTER — Ambulatory Visit (HOSPITAL_COMMUNITY): Payer: Medicare Other

## 2015-01-16 ENCOUNTER — Ambulatory Visit (HOSPITAL_COMMUNITY): Payer: Medicare Other

## 2015-01-17 ENCOUNTER — Ambulatory Visit (HOSPITAL_COMMUNITY): Payer: Medicare Other

## 2015-01-24 ENCOUNTER — Telehealth (HOSPITAL_COMMUNITY): Payer: Self-pay | Admitting: *Deleted

## 2015-01-24 NOTE — Telephone Encounter (Signed)
Patient given detailed instructions per Myocardial Perfusion Study Information Sheet for the test on 01/26/15 at 1000. Patient notified to arrive 15 minutes early and that it is imperative to arrive on time for appointment to keep from having the test rescheduled.  If you need to cancel or reschedule your appointment, please call the office within 24 hours of your appointment. Failure to do so may result in a cancellation of your appointment, and a $50 no show fee. Patient verbalized understanding.Roy Kane W    

## 2015-01-26 ENCOUNTER — Ambulatory Visit (HOSPITAL_COMMUNITY): Payer: Medicare Other | Attending: Cardiology

## 2015-01-26 DIAGNOSIS — R0789 Other chest pain: Secondary | ICD-10-CM

## 2015-01-26 DIAGNOSIS — I10 Essential (primary) hypertension: Secondary | ICD-10-CM | POA: Insufficient documentation

## 2015-01-26 DIAGNOSIS — R9439 Abnormal result of other cardiovascular function study: Secondary | ICD-10-CM | POA: Insufficient documentation

## 2015-01-26 DIAGNOSIS — E119 Type 2 diabetes mellitus without complications: Secondary | ICD-10-CM | POA: Insufficient documentation

## 2015-01-26 DIAGNOSIS — R0609 Other forms of dyspnea: Secondary | ICD-10-CM | POA: Insufficient documentation

## 2015-01-26 MED ORDER — REGADENOSON 0.4 MG/5ML IV SOLN
0.4000 mg | Freq: Once | INTRAVENOUS | Status: AC
  Administered 2015-01-26: 0.4 mg via INTRAVENOUS

## 2015-01-26 MED ORDER — TECHNETIUM TC 99M SESTAMIBI GENERIC - CARDIOLITE
32.6000 | Freq: Once | INTRAVENOUS | Status: AC | PRN
  Administered 2015-01-26: 33 via INTRAVENOUS

## 2015-01-27 ENCOUNTER — Ambulatory Visit (HOSPITAL_COMMUNITY): Payer: Medicare Other | Attending: Cardiology

## 2015-01-27 LAB — MYOCARDIAL PERFUSION IMAGING
CHL CUP NUCLEAR SRS: 7
LV sys vol: 45 mL
LVDIAVOL: 106 mL
NUC STRESS TID: 1.27
Peak HR: 81 {beats}/min
RATE: 0.26
Rest HR: 58 {beats}/min
SDS: 3
SSS: 10

## 2015-01-27 MED ORDER — TECHNETIUM TC 99M SESTAMIBI GENERIC - CARDIOLITE
32.3000 | Freq: Once | INTRAVENOUS | Status: AC | PRN
  Administered 2015-01-27: 32.3 via INTRAVENOUS

## 2015-01-28 ENCOUNTER — Telehealth: Payer: Self-pay | Admitting: Cardiovascular Disease

## 2015-01-28 NOTE — Telephone Encounter (Signed)
I spoke with Roy Kane. He agrees to have the cath. Will need to admit him the night before for IVF hydration. His creatinine is between 2.5 - 3.0  He has a new anterior defect by 2 day myoview ( not present on previous myoview in 2004)   Will see if we can do a diagnostic cath on Wed. Or Thursday with the idea that we would do a PCI the following day or so later to allow the contrast to wash out     Nahser, Roy PingPhilip J, MD  01/28/2015 5:43 PM    [facility] [address],  [address] [place], [place]  [address] Pager [phone] Phone: [phone]; Fax: [phone]   [facility]  [address] [place], [place]  [phone]   Fax [phone]

## 2015-01-30 ENCOUNTER — Encounter: Payer: Self-pay | Admitting: Nurse Practitioner

## 2015-01-30 NOTE — Telephone Encounter (Signed)
Spoke with patient to schedule direct admission to [facility] for tomorrow, 12/13 for hydration in preparation for right and left heart cath scheduled for 12/14 with Dr. Katrinka BlazingSmith.  I advised patient that bed control will call him tomorrow to inform him when his bed is ready.  I advised him to call back with questions or concerns prior to admission.  He verbalized understanding and agreement with plan of care.

## 2015-01-31 ENCOUNTER — Encounter (HOSPITAL_COMMUNITY): Payer: Self-pay | Admitting: Student

## 2015-01-31 ENCOUNTER — Observation Stay (HOSPITAL_COMMUNITY)
Admission: RE | Admit: 2015-01-31 | Discharge: 2015-02-01 | Disposition: A | Discharge status: Home / Self Care | Payer: Medicare Other | Source: Ambulatory Visit | Attending: Cardiovascular Disease | Admitting: Cardiovascular Disease

## 2015-01-31 DIAGNOSIS — Z905 Acquired absence of kidney: Secondary | ICD-10-CM | POA: Diagnosis not present

## 2015-01-31 DIAGNOSIS — I10 Essential (primary) hypertension: Secondary | ICD-10-CM | POA: Diagnosis present

## 2015-01-31 DIAGNOSIS — I5032 Chronic diastolic (congestive) heart failure: Secondary | ICD-10-CM | POA: Insufficient documentation

## 2015-01-31 DIAGNOSIS — Z87891 Personal history of nicotine dependence: Secondary | ICD-10-CM | POA: Insufficient documentation

## 2015-01-31 DIAGNOSIS — E669 Obesity, unspecified: Secondary | ICD-10-CM | POA: Diagnosis present

## 2015-01-31 DIAGNOSIS — I200 Unstable angina: Secondary | ICD-10-CM | POA: Insufficient documentation

## 2015-01-31 DIAGNOSIS — I130 Hypertensive heart and chronic kidney disease with heart failure and stage 1 through stage 4 chronic kidney disease, or unspecified chronic kidney disease: Secondary | ICD-10-CM | POA: Diagnosis not present

## 2015-01-31 DIAGNOSIS — E785 Hyperlipidemia, unspecified: Secondary | ICD-10-CM | POA: Diagnosis not present

## 2015-01-31 DIAGNOSIS — Z6841 Body Mass Index (BMI) 40.0 and over, adult: Secondary | ICD-10-CM | POA: Diagnosis not present

## 2015-01-31 DIAGNOSIS — E1122 Type 2 diabetes mellitus with diabetic chronic kidney disease: Secondary | ICD-10-CM | POA: Insufficient documentation

## 2015-01-31 DIAGNOSIS — Z79899 Other long term (current) drug therapy: Secondary | ICD-10-CM | POA: Insufficient documentation

## 2015-01-31 DIAGNOSIS — N184 Chronic kidney disease, stage 4 (severe): Secondary | ICD-10-CM | POA: Diagnosis present

## 2015-01-31 DIAGNOSIS — Z85528 Personal history of other malignant neoplasm of kidney: Secondary | ICD-10-CM | POA: Diagnosis not present

## 2015-01-31 DIAGNOSIS — Z794 Long term (current) use of insulin: Secondary | ICD-10-CM | POA: Diagnosis not present

## 2015-01-31 DIAGNOSIS — R079 Chest pain, unspecified: Principal | ICD-10-CM | POA: Insufficient documentation

## 2015-01-31 DIAGNOSIS — R9439 Abnormal result of other cardiovascular function study: Secondary | ICD-10-CM | POA: Diagnosis not present

## 2015-01-31 DIAGNOSIS — I272 Other secondary pulmonary hypertension: Secondary | ICD-10-CM | POA: Insufficient documentation

## 2015-01-31 DIAGNOSIS — E119 Type 2 diabetes mellitus without complications: Secondary | ICD-10-CM

## 2015-01-31 DIAGNOSIS — I2510 Atherosclerotic heart disease of native coronary artery without angina pectoris: Secondary | ICD-10-CM | POA: Insufficient documentation

## 2015-01-31 HISTORY — DX: Unspecified osteoarthritis, unspecified site: M19.90

## 2015-01-31 HISTORY — DX: Atherosclerotic heart disease of native coronary artery without angina pectoris: I25.10

## 2015-01-31 HISTORY — DX: Reserved for inherently not codable concepts without codable children: IMO0001

## 2015-01-31 LAB — BASIC METABOLIC PANEL
ANION GAP: 7 (ref 5–15)
BUN: 35 mg/dL — AB (ref 6–20)
CHLORIDE: 104 mmol/L (ref 101–111)
CO2: 27 mmol/L (ref 22–32)
Calcium: 9.4 mg/dL (ref 8.9–10.3)
Creatinine, Ser: 2.69 mg/dL — ABNORMAL HIGH (ref 0.61–1.24)
GFR calc Af Amer: 24 mL/min — ABNORMAL LOW (ref >60)
GFR, EST NON AFRICAN AMERICAN: 21 mL/min — AB (ref >60)
GLUCOSE: 184 mg/dL — AB (ref 65–99)
POTASSIUM: 5.1 mmol/L (ref 3.5–5.1)
Sodium: 138 mmol/L (ref 135–145)

## 2015-01-31 LAB — GLUCOSE, CAPILLARY
GLUCOSE-CAPILLARY: 167 mg/dL — AB (ref 65–99)
GLUCOSE-CAPILLARY: 194 mg/dL — AB (ref 65–99)
Glucose-Capillary: 179 mg/dL — ABNORMAL HIGH (ref 65–99)

## 2015-01-31 MED ORDER — CARVEDILOL 25 MG PO TABS
25.0000 mg | ORAL_TABLET | Freq: Two times a day (BID) | ORAL | Status: DC
Start: 2015-01-31 — End: 2015-01-31
  Filled 2015-01-31: qty 1

## 2015-01-31 MED ORDER — ENOXAPARIN SODIUM 40 MG/0.4ML ~~LOC~~ SOLN
40.0000 mg | SUBCUTANEOUS | Status: DC
  Administered 2015-01-31: 40 mg via SUBCUTANEOUS
  Filled 2015-01-31: qty 0.4

## 2015-01-31 MED ORDER — SIMVASTATIN 40 MG PO TABS
40.0000 mg | ORAL_TABLET | Freq: Every day | ORAL | Status: DC
  Administered 2015-01-31: 40 mg via ORAL
  Filled 2015-01-31: qty 1

## 2015-01-31 MED ORDER — INSULIN GLARGINE 100 UNIT/ML ~~LOC~~ SOLN
10.0000 [IU] | Freq: Every day | SUBCUTANEOUS | Status: DC
  Filled 2015-01-31: qty 0.1

## 2015-01-31 MED ORDER — ADULT MULTIVITAMIN W/MINERALS CH
1.0000 | ORAL_TABLET | Freq: Every day | ORAL | Status: DC
  Administered 2015-01-31 – 2015-02-01 (×2): 1 via ORAL
  Filled 2015-01-31: qty 1

## 2015-01-31 MED ORDER — NITROGLYCERIN 0.4 MG SL SUBL: 0.4000 mg | SUBLINGUAL_TABLET | SUBLINGUAL | Status: DC | PRN

## 2015-01-31 MED ORDER — ASPIRIN 81 MG PO CHEW
81.0000 mg | CHEWABLE_TABLET | ORAL | Status: AC
  Administered 2015-02-01: 81 mg via ORAL
  Filled 2015-01-31: qty 1

## 2015-01-31 MED ORDER — ACETAMINOPHEN 325 MG PO TABS: 650.0000 mg | ORAL_TABLET | ORAL | Status: DC | PRN

## 2015-01-31 MED ORDER — INSULIN ASPART 100 UNIT/ML ~~LOC~~ SOLN
0.0000 [IU] | Freq: Three times a day (TID) | SUBCUTANEOUS | Status: DC
  Administered 2015-01-31 – 2015-02-01 (×2): 3 [IU] via SUBCUTANEOUS

## 2015-01-31 MED ORDER — ONDANSETRON HCL 4 MG/2ML IJ SOLN: 4.0000 mg | Freq: Four times a day (QID) | INTRAMUSCULAR | Status: DC | PRN

## 2015-01-31 MED ORDER — SODIUM CHLORIDE 0.9 % IJ SOLN
3.0000 mL | Freq: Two times a day (BID) | INTRAMUSCULAR | Status: DC
  Administered 2015-02-01: 3 mL via INTRAVENOUS

## 2015-01-31 MED ORDER — SODIUM CHLORIDE 0.9 % IV SOLN
INTRAVENOUS | Status: DC
  Administered 2015-01-31: 19:00:00 via INTRAVENOUS

## 2015-01-31 MED ORDER — CARVEDILOL 25 MG PO TABS: 25.0000 mg | ORAL_TABLET | ORAL | Status: DC

## 2015-01-31 MED ORDER — SODIUM CHLORIDE 0.9 % IV SOLN: 250.0000 mL | INTRAVENOUS | Status: DC | PRN

## 2015-01-31 MED ORDER — SODIUM CHLORIDE 0.9 % IJ SOLN: 3.0000 mL | INTRAMUSCULAR | Status: DC | PRN

## 2015-01-31 MED ORDER — CARVEDILOL 25 MG PO TABS
25.0000 mg | ORAL_TABLET | Freq: Two times a day (BID) | ORAL | Status: DC
  Administered 2015-01-31 – 2015-02-01 (×2): 25 mg via ORAL
  Filled 2015-01-31 (×2): qty 1

## 2015-01-31 MED ORDER — BUDESONIDE-FORMOTEROL FUMARATE 160-4.5 MCG/ACT IN AERO
1.0000 | INHALATION_SPRAY | Freq: Two times a day (BID) | RESPIRATORY_TRACT | Status: DC | PRN
  Filled 2015-01-31: qty 6

## 2015-01-31 MED ORDER — SIMVASTATIN 40 MG PO TABS
40.0000 mg | ORAL_TABLET | Freq: Every day | ORAL | Status: DC
  Filled 2015-01-31: qty 1

## 2015-01-31 NOTE — Progress Notes (Signed)
Patient arrived to the floor via direct adm. VSS. No SOB or Chest pain. Tele placed and CCMD notified. Patient oriented to the floor and the unit. Call bell within reach.MD notified of arrival to the floor. Will continue to monitor.   Valinda HoarLexie Antron Seth RN

## 2015-01-31 NOTE — Progress Notes (Signed)
   01/31/15 1405  Clinical Encounter Type  Visited With Patient;Family  Visit Type Initial;Spiritual support;Social support  Spiritual Encounters  Spiritual Needs Emotional  Stress Factors  Family Stress Factors Major life changes;Health changes  Advance Directives (For Healthcare)  Does patient have an advance directive? Yes  Type of Estate agentAdvance Directive Healthcare Power of [place];Living will  Does patient want to make changes to advanced directive? No - Patient declined  Copy of advanced directive(s) in chart? No - copy requested  Ch visited with pt and with spouse to offer spiritual and religious support; will follow-up after procedure on 12/14.  2:30 PM Roy LevineMichael I Honor Kane

## 2015-01-31 NOTE — H&P (Signed)
Patient ID: Roy Kane MRN: [id number], DOB/AGE: [birth year]   Admit date: 01/31/2015   Primary Physician: Tula Nakayama Primary Cardiologist: Dr. Acie Fredrickson   WUJ:[id number] Roy Kane is a 81 y.o. male with past medical history of Chronic diastolic CHF, Renal Cell Carcinoma (s/p Partial Nephrectomy), Stage 4 CKD,Type 2 DM, and HTN who presents to [facility] on 01/31/2015 for pre-cardiac catheterization.   He was last seen in the office by Dr. Acie Fredrickson on 01/03/2015 and had worsening shortness of breath accompanied by an "electric sensation" going across his left chest and down his left arm. He was scheduled for a Lexiscan Myoview at that time.  He underwent stress testing on 01/26/2015 which showed a large defect of moderate severity present in the basal anterior, basal anterolateral, mid anterior, mid anteroseptal, apical anterior, apical septal and apex location, consistent with ischemia in the LAD territory. A cardiac catheterization was recommended for further evaluation. Per Dr. Acie Fredrickson, he would likely need a diagnostic catheterization tomorrow followed by PCI the following day to allow time for the contrast to wash out.  Due to his Stage 4 CKD (creatinine last checked on 12/26/2014 and was 2.62 at that time), pre-hydration for his catheterization was recommended.  He denies any current chest pain or palpitations. Still having dyspnea with exertion. Says he had to stop and rest several times when walking into the hospital today.   Home Medications  Prior to Admission medications   Medication Sig Start Date End Date Taking? Authorizing Provider  budesonide-formoterol (SYMBICORT) 160-4.5 MCG/ACT inhaler Inhale 1 puff into the lungs 2 (two) times daily as needed (shortness of breath).   Yes Historical Provider, MD  calcitRIOL (ROCALTROL) 0.5 MCG capsule Take 0.5 mcg by mouth 2 (two) times a week. Monday and Friday   Yes Historical Provider, MD  carvedilol (COREG) 25 MG tablet  Take 1 tablet (25 mg total) by mouth 2 (two) times daily with a meal. Patient must have appointment  before any more rx call be refill. 10/25/14  Yes Thayer Headings, MD  furosemide (LASIX) 20 MG tablet Take 60 mg by mouth daily.   Yes Historical Provider, MD  insulin aspart (NOVOLOG FLEXPEN) 100 UNIT/ML FlexPen Inject 10 Units into the skin 3 (three) times daily with meals.   Yes Historical Provider, MD  insulin glargine (LANTUS) 100 unit/mL SOPN Inject 20 Units into the skin daily before breakfast.   Yes Historical Provider, MD  losartan (COZAAR) 25 MG tablet Take 25 mg by mouth daily.   Yes Historical Provider, MD  Multiple Vitamin (MULTIVITAMIN WITH MINERALS) TABS tablet Take 1 tablet by mouth daily.   Yes Historical Provider, MD  simvastatin (ZOCOR) 40 MG tablet TAKE 1 TABLET BY MOUTH EVERY EVENING. NEED OFFICE APPOINTMENT WITH MD Patient taking differently: TAKE 1 TABLET BY MOUTH DAILY AT BEDTIME. NEED OFFICE APPOINTMENT WITH MD 07/20/14  Yes Thayer Headings, MD  Blood Glucose Monitoring Suppl (ONE TOUCH ULTRA SYSTEM KIT) W/DEVICE KIT Use as instructed 12/27/10   Historical Provider, MD    Allergies Allergies  Allergen Reactions  . Lisinopril Cough  . Neomycin-Polymyxin-Dexameth Swelling    REACTION:  Maxitrol eye ointment caused eye swelling  . Sulfamethoxazole Swelling and Other (See Comments)    lethargic  . Tape Rash    tears skin - paper tape OK    Past Medical History Past Medical History  Diagnosis Date  . Obesity, morbid ([place])   . Diastolic dysfunction     CHRONIC  . Renal cell  carcinoma   . S/p nephrectomy   . Hyperkalemia   . Hypertension   . Eye infection     CHRONIC LEFT EYE  . Diabetes mellitus   . Venous insufficiency      Surgical History   Past Surgical History  Procedure Laterality Date  . Total knee arthroplasty    . Hernia repair      BILATERAL  . Prostate surgery    . Gallbladder surgery       Family History  Family History  Problem Relation  Age of Onset  . Heart disease Father     CHF  . Cancer Paternal Grandmother     Social History  Social History   Social History  . Marital Status: Married    Spouse Name: N/A  . Number of Children: N/A  . Years of Education: N/A   Occupational History  . Not on file.   Social History Main Topics  . Smoking status: Former Smoker -- 1.50 packs/day for 25 years    Types: Cigarettes    Quit date: 02/19/1980  . Smokeless tobacco: Not on file  . Alcohol Use: No  . Drug Use: No  . Sexual Activity: Not on file   Other Topics Concern  . Not on file   Social History Narrative     Review of Systems General:  No chills, fever, night sweats or weight changes.  Cardiovascular:  No chest pain, edema, orthopnea, palpitations, paroxysmal nocturnal dyspnea. Positive for dyspnea with exertion. Dermatological: No rash, lesions/masses Respiratory: No cough, dyspnea Urologic: No hematuria, dysuria Abdominal:   No nausea, vomiting, diarrhea, bright red blood per rectum, melena, or hematemesis Neurologic:  No visual changes, wkns, changes in mental status. All other systems reviewed and are otherwise negative except as noted above.   Physical Exam: Blood pressure 143/83, pulse 65, temperature 97.7 F (36.5 C), temperature source Oral, resp. rate 20, height 5' 2" (1.575 m), weight 234 lb 9.1 oz (106.4 kg), SpO2 95 %.  General: Well developed, well nourished elderly,male in no acute distress. Head: Normocephalic, atraumatic, sclera non-icteric, no xanthomas, nares are without discharge. Dentition:  Neck: No carotid bruits. JVD not elevated.  Lungs: Respirations regular and unlabored, without wheezes or rales.  Heart: Regular rate and rhythm. No S3 or S4.  No murmur, no rubs, or gallops appreciated. Abdomen: Soft, non-tender, non-distended with normoactive bowel sounds. No hepatomegaly. No rebound/guarding. No obvious abdominal masses. Msk:  Strength and tone appear normal for age. No  joint deformities or effusions. Extremities: No clubbing or cyanosis. Trace edema.  Distal pedal pulses are 2+ bilaterally. Neuro: Alert and oriented X 3. Moves all extremities spontaneously. No focal deficits noted. Psych:  Responds to questions appropriately with a normal affect. Skin: No rashes or lesions noted   Labs: Pending  ECG: 01/04/2015: NSR with rate in 70's. No acute changes from previous tracings.  Echo: 05/2013 Study Conclusions - Left ventricle: The cavity size was normal. There was moderate concentric hypertrophy. Systolic function was normal. The estimated ejection fraction was in the range of 55% to 60%. Wall motion was normal; there were no regional wall motion abnormalities. Doppler parameters are consistent with abnormal left ventricular relaxation (grade 1 diastolic dysfunction). Doppler parameters are consistent with elevated ventricular end-diastolic filling pressure. - Aortic valve: Trileaflet; normal thickness leaflets. Mild regurgitation. Valve area: 5.34cm^2(VTI). Valve area: 4.9cm^2 (Vmax). - Aortic root: The aortic root was normal in size. - Mitral valve: Mild regurgitation. - Left atrium: The atrium was moderately  dilated. - Right ventricle: Systolic function was normal. - Right atrium: The atrium was normal in size. - Tricuspid valve: No regurgitation. - Pulmonic valve: Mild regurgitation. - Pulmonary arteries: Systolic pressure was within the normal range. - Pericardium, extracardiac: There was no pericardial effusion.  Radiology/Studies: No results found.   ASSESSMENT AND PLAN:   1. Unstable Angina - seen in November for worsening dyspnea with exertion and an "electric sensation" going from his left chest down his left arm. NST was recommended which showed a large defect of moderate severity present in the basal anterior, basal anterolateral, mid anterior, mid anteroseptal, apical anterior, apical septal and apex  location, consistent with ischemia in the LAD territory. - for diagnostic catheterization tomorrow. May need PCI procedure the following day to allow for contrast wash-out. Will make NPO after midnight and start IV hydration at 1800 this evening. - continue BB and statin  2. CKD (chronic kidney disease), stage IV (HCC) - creatinine 2.62 on 12/26/2014. Checking BMET now. - pre-hydrating for cardiac catheterization tomorrow - continue to monitor with daily BMET  3. DM (diabetes mellitus), type 2 (HCC) - SSI while admitted  4. Hyperlipidemia - continue statin  5. Chronic diastolic CHF (congestive heart failure), NYHA class 2 (HCC) - EF 55-60% with Grade 1 DD noted on echo in 2015. - Has trace lower extremity edema but does not appear volume overloaded on exam - holding home Lasix in preparation for cath.  6. HTN - Losartan held in preparation for cath. - continue BB  Signed, Brittany M Strader, PA-C 01/31/2015, 12:35 PM Pager: [phone]   Attending Note:   The patient was seen and examined.  Agree with assessment and plan as noted above.  Changes made to the above note as needed.  Pt presents with several episodes of CP Last several minutes, brings him to his knees. myoview shows an anterior defect c/w an LAD occlusion  I have given this lots of thought and think that he needs a cardiac cath. He has significant CKD and is at risk for renal failure Have admitted him the night prior to cath to hopefully prevent renal failure.  Will do a right heart cath as well     Philip J. Nahser, Jr., MD, FACC 01/31/2015, 1:20 PM [address],  [address] Office - [phone] Pager [phone]      

## 2015-02-01 ENCOUNTER — Other Ambulatory Visit: Payer: Self-pay | Admitting: Nurse Practitioner

## 2015-02-01 ENCOUNTER — Encounter (HOSPITAL_COMMUNITY)
Admission: RE | Disposition: A | Discharge status: Home / Self Care | Payer: Self-pay | Source: Ambulatory Visit | Attending: Cardiovascular Disease

## 2015-02-01 ENCOUNTER — Other Ambulatory Visit: Payer: Self-pay | Admitting: Physician Assistant

## 2015-02-01 ENCOUNTER — Encounter (HOSPITAL_COMMUNITY): Payer: Self-pay | Admitting: Physician Assistant

## 2015-02-01 DIAGNOSIS — N184 Chronic kidney disease, stage 4 (severe): Secondary | ICD-10-CM | POA: Diagnosis not present

## 2015-02-01 DIAGNOSIS — I5032 Chronic diastolic (congestive) heart failure: Secondary | ICD-10-CM

## 2015-02-01 DIAGNOSIS — I272 Other secondary pulmonary hypertension: Secondary | ICD-10-CM

## 2015-02-01 DIAGNOSIS — R0683 Snoring: Secondary | ICD-10-CM

## 2015-02-01 DIAGNOSIS — R9439 Abnormal result of other cardiovascular function study: Secondary | ICD-10-CM | POA: Diagnosis not present

## 2015-02-01 DIAGNOSIS — R931 Abnormal findings on diagnostic imaging of heart and coronary circulation: Secondary | ICD-10-CM | POA: Diagnosis not present

## 2015-02-01 DIAGNOSIS — R079 Chest pain, unspecified: Secondary | ICD-10-CM | POA: Diagnosis not present

## 2015-02-01 DIAGNOSIS — I2510 Atherosclerotic heart disease of native coronary artery without angina pectoris: Secondary | ICD-10-CM

## 2015-02-01 HISTORY — PX: CARDIAC CATHETERIZATION: SHX172

## 2015-02-01 LAB — CBC
HCT: 38 % — ABNORMAL LOW (ref 39.0–52.0)
HEMOGLOBIN: 11.8 g/dL — AB (ref 13.0–17.0)
MCH: 30.3 pg (ref 26.0–34.0)
MCHC: 31.1 g/dL (ref 30.0–36.0)
MCV: 97.7 fL (ref 78.0–100.0)
PLATELETS: 132 10*3/uL — AB (ref 150–400)
RBC: 3.89 MIL/uL — AB (ref 4.22–5.81)
RDW: 14.1 % (ref 11.5–15.5)
WBC: 4.4 10*3/uL (ref 4.0–10.5)

## 2015-02-01 LAB — GLUCOSE, CAPILLARY
Glucose-Capillary: 188 mg/dL — ABNORMAL HIGH (ref 65–99)
Glucose-Capillary: 158 mg/dL — ABNORMAL HIGH (ref 65–99)
Glucose-Capillary: 172 mg/dL — ABNORMAL HIGH (ref 65–99)
Glucose-Capillary: 181 mg/dL — ABNORMAL HIGH (ref 65–99)

## 2015-02-01 LAB — BASIC METABOLIC PANEL
ANION GAP: 7 (ref 5–15)
BUN: 37 mg/dL — ABNORMAL HIGH (ref 6–20)
CALCIUM: 8.8 mg/dL — AB (ref 8.9–10.3)
CO2: 28 mmol/L (ref 22–32)
CREATININE: 2.64 mg/dL — AB (ref 0.61–1.24)
Chloride: 104 mmol/L (ref 101–111)
GFR, EST AFRICAN AMERICAN: 25 mL/min — AB (ref >60)
GFR, EST NON AFRICAN AMERICAN: 21 mL/min — AB (ref >60)
Glucose, Bld: 220 mg/dL — ABNORMAL HIGH (ref 65–99)
Potassium: 4.6 mmol/L (ref 3.5–5.1)
SODIUM: 139 mmol/L (ref 135–145)

## 2015-02-01 LAB — POCT I-STAT 3, ART BLOOD GAS (G3+)
BICARBONATE: 25.9 meq/L — AB (ref 20.0–24.0)
O2 Saturation: 93 %
PCO2 ART: 44.5 mmHg (ref 35.0–45.0)
PH ART: 7.374 (ref 7.350–7.450)
PO2 ART: 70 mmHg — AB (ref 80.0–100.0)
TCO2: 27 mmol/L (ref 0–100)

## 2015-02-01 LAB — POCT ACTIVATED CLOTTING TIME
ACTIVATED CLOTTING TIME: 193 s
Activated Clotting Time: 219 seconds

## 2015-02-01 LAB — PROTIME-INR
INR: 1.12 (ref 0.00–1.49)
PROTHROMBIN TIME: 14.5 s (ref 11.6–15.2)

## 2015-02-01 SURGERY — RIGHT/LEFT HEART CATH AND CORONARY ANGIOGRAPHY
Anesthesia: LOCAL

## 2015-02-01 MED ORDER — MIDAZOLAM HCL 2 MG/2ML IJ SOLN
INTRAMUSCULAR | Status: AC
  Filled 2015-02-01: qty 2

## 2015-02-01 MED ORDER — SODIUM CHLORIDE 0.9 % IJ SOLN: 3.0000 mL | INTRAMUSCULAR | Status: DC | PRN

## 2015-02-01 MED ORDER — FENTANYL CITRATE (PF) 100 MCG/2ML IJ SOLN
INTRAMUSCULAR | Status: AC
  Filled 2015-02-01: qty 2

## 2015-02-01 MED ORDER — SODIUM CHLORIDE 0.9 % IV SOLN: 250.0000 mL | INTRAVENOUS | Status: DC | PRN

## 2015-02-01 MED ORDER — FENTANYL CITRATE (PF) 100 MCG/2ML IJ SOLN
INTRAMUSCULAR | Status: DC | PRN
  Administered 2015-02-01: 25 ug via INTRAVENOUS

## 2015-02-01 MED ORDER — SODIUM CHLORIDE 0.9 % WEIGHT BASED INFUSION: 3.0000 mL/kg/h | INTRAVENOUS | Status: DC

## 2015-02-01 MED ORDER — SODIUM CHLORIDE 0.9 % IJ SOLN
3.0000 mL | Freq: Two times a day (BID) | INTRAMUSCULAR | Status: DC
Start: 2015-02-01 — End: 2015-02-01

## 2015-02-01 MED ORDER — HEPARIN SODIUM (PORCINE) 1000 UNIT/ML IJ SOLN
INTRAMUSCULAR | Status: AC
  Filled 2015-02-01: qty 1

## 2015-02-01 MED ORDER — ONDANSETRON HCL 4 MG/2ML IJ SOLN: 4.0000 mg | Freq: Four times a day (QID) | INTRAMUSCULAR | Status: DC | PRN

## 2015-02-01 MED ORDER — HEPARIN SODIUM (PORCINE) 1000 UNIT/ML IJ SOLN
INTRAMUSCULAR | Status: DC | PRN
  Administered 2015-02-01: 5000 [IU] via INTRAVENOUS

## 2015-02-01 MED ORDER — MIDAZOLAM HCL 2 MG/2ML IJ SOLN
INTRAMUSCULAR | Status: DC | PRN
  Administered 2015-02-01: 1 mg via INTRAVENOUS

## 2015-02-01 MED ORDER — HEPARIN (PORCINE) IN NACL 2-0.9 UNIT/ML-% IJ SOLN
INTRAMUSCULAR | Status: AC
  Filled 2015-02-01: qty 1500

## 2015-02-01 MED ORDER — HYDRALAZINE HCL 20 MG/ML IJ SOLN
10.0000 mg | INTRAMUSCULAR | Status: DC | PRN
Start: 2015-02-01 — End: 2015-02-01
  Administered 2015-02-01: 10 mg via INTRAVENOUS
  Filled 2015-02-01: qty 1

## 2015-02-01 MED ORDER — VERAPAMIL HCL 2.5 MG/ML IV SOLN
INTRAVENOUS | Status: DC | PRN
  Administered 2015-02-01: 14:00:00 via INTRA_ARTERIAL

## 2015-02-01 MED ORDER — LIDOCAINE HCL (PF) 1 % IJ SOLN
INTRAMUSCULAR | Status: DC | PRN
  Administered 2015-02-01: 14:00:00

## 2015-02-01 MED ORDER — ACETAMINOPHEN 325 MG PO TABS: 650.0000 mg | ORAL_TABLET | ORAL | Status: DC | PRN

## 2015-02-01 MED ORDER — LIDOCAINE HCL (PF) 1 % IJ SOLN
INTRAMUSCULAR | Status: AC
  Filled 2015-02-01: qty 30

## 2015-02-01 MED ORDER — VERAPAMIL HCL 2.5 MG/ML IV SOLN
INTRAVENOUS | Status: AC
  Filled 2015-02-01: qty 2

## 2015-02-01 SURGICAL SUPPLY — 12 items
CATH BALLN WEDGE 5F 110CM (CATHETERS) ×2 IMPLANT
CATH INFINITI 5 FR JL3.5 (CATHETERS) ×2 IMPLANT
CATH INFINITI JR4 5F (CATHETERS) ×2 IMPLANT
DEVICE RAD COMP TR BAND LRG (VASCULAR PRODUCTS) ×2 IMPLANT
GLIDESHEATH SLEND A-KIT 6F 22G (SHEATH) ×2 IMPLANT
GUIDEWIRE .025 260CM (WIRE) ×2 IMPLANT
KIT HEART LEFT (KITS) ×2 IMPLANT
PACK CARDIAC CATHETERIZATION (CUSTOM PROCEDURE TRAY) ×2 IMPLANT
SHEATH FAST CATH BRACH 5F 5CM (SHEATH) ×2 IMPLANT
TRANSDUCER W/STOPCOCK (MISCELLANEOUS) ×4 IMPLANT
TUBING CIL FLEX 10 FLL-RA (TUBING) ×2 IMPLANT
WIRE SAFE-T 1.5MM-J .035X260CM (WIRE) ×2 IMPLANT

## 2015-02-01 NOTE — Progress Notes (Signed)
Site area: right ac venous sheath Site Prior to Removal:  Level 0 Pressure Applied For: 20 minutes Manual:   yes Patient Status During Pull:  stable  Post Pull Site:  Level  0 Post Pull Instructions Given:  Yes  Post Pull Pulses Present: yes  Dressing Applied:  Small tegaderm Bedrest begins @  NA Comments:

## 2015-02-01 NOTE — Discharge Summary (Signed)
Discharge Summary   Patient ID: Roy Kane MRN: [id number], DOB/AGE: 1932-05-30 81 y.o. Admit date: 01/31/2015 D/C date:     02/01/2015  Primary Cardiologist: Dr. Acie Fredrickson   Principal Problem:   Abnormal nuclear stress test Active Problems:   DM (diabetes mellitus), type 2 (HCC)   Hyperlipidemia   OBESITY   Essential hypertension   CKD (chronic kidney disease), stage IV (HCC)   Chronic diastolic CHF (congestive heart failure), NYHA class 2 (HCC)   CAD (coronary artery disease)    Admission Dates: 01/31/15- 02/01/15 Discharge Diagnosis: chest pain  HPI: Roy Kane is a 81 y.o. male with a history of chronic diastolic CHF, Renal Cell Carcinoma (s/p Partial Nephrectomy), Stage 4 CKD,Type 2 DM, and HTN who presented to [facility] on 01/31/2015 for pre-cardiac catheterization.   He was last seen in the office by Dr. Acie Fredrickson on 01/03/2015 and had worsening shortness of breath accompanied by an "electric sensation" going across his left chest and down his left arm. He was scheduled for a Lexiscan Myoview at that time.   He underwent stress testing on 01/26/2015 which showed a large defect of moderate severity present in the basal anterior, basal anterolateral, mid anterior, mid anteroseptal, apical anterior, apical septal and apex location, consistent with ischemia in the LAD territory. A cardiac catheterization was recommended for further evaluation. Per Dr. Acie Fredrickson, he would likely need a diagnostic catheterization tomorrow followed by PCI the following day to allow time for the contrast to wash out.  Due to his Stage 4 CKD (creatinine last checked on 12/26/2014 and was 2.62 at that time), pre-hydration for his catheterization was recommended.   Hospital Course  1. Abnormal Stress test--> false positive myocardial perfusion study -- Admitted for pre-cath hydration prior to [facility] on 02/01/15 which revealed non obstructive CAD with widely patent coronary arteries, moderate  pulmonary hypertension, with PA systolic pressures 27-78 mmHg. Mean pulmonary artery pressure 34 mmHg.  - continue BB and statin  2. CKD, stage IV - creatinine 2.64. Pre-hydrated for cardiac catheterization. We will obtain a repeat BMET on Friday which has been scheduled   3. DM (diabetes mellitus), type 2 ([place]) - continue current regimen  4. Hyperlipidemia - continue statin  5. Chronic diastolic CHF (congestive heart failure), NYHA class 2 ([place]) - EF 55-60% with Grade 1 DD noted on echo in 2015. - Lasix held for cath and will continue to hold until we see his BMET on Friday.   6. HTN - Losartan held in preparation for cath. Will also hold until Friday when we see his BMET. If stable we can resume this and lasix.  - continue BB  7. Possible OSA- will have sleep study arranged as an outpatient   The patient has had an uncomplicated hospital course and is recovering well. The radial catheter site is stable. He has been seen by Dr. Acie Fredrickson today and deemed ready for discharge home. All follow-up appointments have been scheduled. Discharge medications are listed below.   Discharge Vitals: Blood pressure 153/66, pulse 83, temperature 98.4 F (36.9 C), temperature source Oral, resp. rate 18, height 5' 2" (1.575 m), weight 235 lb 14.3 oz (107 kg), SpO2 92 %.  Labs: Lab Results  Component Value Date   WBC 4.4 02/01/2015   HGB 11.8* 02/01/2015   HCT 38.0* 02/01/2015   MCV 97.7 02/01/2015   PLT 132* 02/01/2015     Recent Labs Lab 02/01/15 0326  NA 139  K 4.6  CL 104  CO2 28  BUN 37*  CREATININE 2.64*  CALCIUM 8.8*  GLUCOSE 220*   No results for input(s): CKTOTAL, CKMB, TROPONINI in the last 72 hours. Lab Results  Component Value Date   CHOL 157 05/14/2013   HDL 51.80 05/14/2013   LDLCALC 65 05/14/2013   TRIG 203.0* 05/14/2013   No results found for: DDIMER  Diagnostic Studies/Procedures   Echo: 05/2013 Study Conclusions - Left ventricle: The cavity size was  normal. There was moderate concentric hypertrophy. Systolic function was normal. The estimated ejection fraction was in the range of 55% to 60%. Wall motion was normal; there were no regional wall motion abnormalities. Doppler parameters are consistent with abnormal left ventricular relaxation (grade 1 diastolic dysfunction). Doppler parameters are consistent with elevated ventricular end-diastolic filling pressure. - Aortic valve: Trileaflet; normal thickness leaflets. Mild regurgitation. Valve area: 5.34cm^2(VTI). Valve area: 4.9cm^2 (Vmax). - Aortic root: The aortic root was normal in size. - Mitral valve: Mild regurgitation. - Left atrium: The atrium was moderately dilated. - Right ventricle: Systolic function was normal. - Right atrium: The atrium was normal in size. - Tricuspid valve: No regurgitation. - Pulmonic valve: Mild regurgitation. - Pulmonary arteries: Systolic pressure was within the normal range. - Pericardium, extracardiac: There was no pericardial effusion.  02/01/15 Procedures    Right/Left Heart Cath and Coronary Angiography    Conclusion    1. Ost 1st Mrg lesion, 65% stenosed. 2. Dist LAD lesion, 15% stenosed. 3. Prox Cx to Mid Cx lesion, 45% stenosed.   Widely patent coronary arteries.  Moderate pulmonary hypertension, with PA systolic pressures 29-93 mmHg. Mean pulmonary artery pressure 34 mmHg.  False positive myocardial perfusion study  Recommendations:  Discussed with Dr. Acie Fredrickson  Hydration this afternoon and discharge later. Basic metabolic panel in 48 hours.      Discharge Medications     Medication List    STOP taking these medications        furosemide 20 MG tablet  Commonly known as:  LASIX     losartan 25 MG tablet  Commonly known as:  COZAAR      TAKE these medications        budesonide-formoterol 160-4.5 MCG/ACT inhaler  Commonly known as:  SYMBICORT  Inhale 1 puff into the lungs 2 (two)  times daily as needed (shortness of breath).     calcitRIOL 0.5 MCG capsule  Commonly known as:  ROCALTROL  Take 0.5 mcg by mouth 2 (two) times a week. Monday and Friday     carvedilol 25 MG tablet  Commonly known as:  COREG  Take 1 tablet (25 mg total) by mouth 2 (two) times daily with a meal. Patient must have appointment  before any more rx call be refill.     insulin glargine 100 unit/mL Sopn  Commonly known as:  LANTUS  Inject 20 Units into the skin daily before breakfast.     multivitamin with minerals Tabs tablet  Take 1 tablet by mouth daily.     NOVOLOG FLEXPEN 100 UNIT/ML FlexPen  Generic drug:  insulin aspart  Inject 10 Units into the skin 3 (three) times daily with meals.     ONE TOUCH ULTRA SYSTEM KIT W/DEVICE Kit  Use as instructed     simvastatin 40 MG tablet  Commonly known as:  ZOCOR  TAKE 1 TABLET BY MOUTH EVERY EVENING. NEED OFFICE APPOINTMENT WITH MD        Disposition   The patient will be discharged in stable condition to home.  Follow-up Information    Follow up with Nahser, Wonda Cheng, MD On 03/09/2015.   Specialty:  Cardiology   Why:  @ 2:15 pm   Contact information:   [address] [place] [address] [phone]       Follow up with [place].   Why:  [address] at 9:45am for lab work   [employer] information:   [address] [place] [address] [phone]        Duration of Discharge Encounter: Greater than 30 minutes including physician and PA time.  Mable Fill R PA-C 02/01/2015, 5:14 PM   Attending Note:   The patient was seen and examined.  Agree with assessment and plan as noted above.  Changes made to the above note as needed.  The cath shows no CAD.   The abnormal myoview must have been falsely positive. Dr. Tamala Julian used only ~ 20 cc of contrast so hopefully his renal function will remain stable He does have  moderate pulmonary HTN and needs a sleep study .   Thayer Headings, Brooke Bonito., MD, [facility] 02/02/2015, 10:12 AM [address],  [place] Pager [phone]

## 2015-02-01 NOTE — Interval H&P Note (Signed)
Cath Lab Visit (complete for each Cath Lab visit)  Clinical Evaluation Leading to the Procedure:   ACS: No.  Non-ACS:    Anginal Classification: CCS III  Anti-ischemic medical therapy: Minimal Therapy (1 class of medications)  Non-Invasive Test Results: High-risk stress test findings: cardiac mortality >3%/year  Prior CABG: No previous CABG      History and Physical Interval Note:  02/01/2015 1:28 PM  Lysle RubensYigael Servellon  has presented today for surgery, with the diagnosis of abnormal myoview  The various methods of treatment have been discussed with the patient and family. After consideration of risks, benefits and other options for treatment, the patient has consented to  Procedure(s): Right/Left Heart Cath and Coronary Angiography (N/A) as a surgical intervention .  The patient's history has been reviewed, patient examined, no change in status, stable for surgery.  I have reviewed the patient's chart and labs.  Questions were answered to the patient's satisfaction.     Lesleigh NoeSMITH III,Ellana Kawa W

## 2015-02-01 NOTE — Progress Notes (Signed)
Patient Name: Roy Kane Date of Encounter: 02/01/2015  Principal Problem:   Unstable angina (HCC) Active Problems:   CKD (chronic kidney disease), stage IV (HCC)   Abnormal nuclear stress test   DM (diabetes mellitus), type 2 (HCC)   Hyperlipidemia   Chronic diastolic CHF (congestive heart failure), NYHA class 2 [facility](HCC)    Primary Cardiologist: Dr. Elease HashimotoNahser Patient Profile: 81 y.o. male w/ PMH of Chronic diastolic CHF, Renal Cell Carcinoma (s/p Partial Nephrectomy), Stage 4 CKD,Type 2 DM, and HTN who [facility] for pre-cardiac catheterization hydration following an abnormal NST on 01/26/2015.   SUBJECTIVE: Denies any chest pain, palpitations, or shortness of breath. Not pleased with his cath time being pushed back from 0830 to noon.  OBJECTIVE Filed Vitals:   01/31/15 1203 01/31/15 1348 01/31/15 2143 02/01/15 0602  BP: 143/83 164/69 190/89 191/82  Pulse: 65 52 61 78  Temp: 97.7 F (36.5 C) 98 F (36.7 C) 98.5 F (36.9 C) 98.4 F (36.9 C)  TempSrc: Oral Oral Oral Oral  Resp: 20 20 18 18   Height: 5\' 2"  (1.575 m)     Weight: 234 lb 9.1 oz (106.4 kg)   235 lb 14.3 oz (107 kg)  SpO2: 95% 96% 96% 94%    Intake/Output Summary (Last 24 hours) at 02/01/15 [id number] Last data filed at 02/01/15 [id number]  Gross per 24 hour  Intake   1500 ml  Output      0 ml  Net   1500 ml   Filed Weights   01/31/15 1203 02/01/15 0602  Weight: 234 lb 9.1 oz (106.4 kg) 235 lb 14.3 oz (107 kg)    PHYSICAL EXAM General: Well-developed, elderly male in no acute distress. Head: Normocephalic, atraumatic.  Neck: Supple without bruits, JVD not elevated. Lungs:  Resp regular and unlabored, CTA without wheezing or rales. Heart: RRR, S1, S2, no S3, S4, or murmur; no rub. Abdomen: Soft, non-tender, non-distended with normoactive bowel sounds. No hepatomegaly. No rebound/guarding. No obvious abdominal masses. Extremities: No clubbing or cyanosis, trace edema  bilaterally, L>R. Distal pedal pulses are 2+ bilaterally. Neuro: Alert and oriented X 3. Moves all extremities spontaneously. Psych: Normal affect.   LABS: CBC: Recent Labs  02/01/15 0326  WBC 4.4  HGB 11.8*  HCT 38.0*  MCV 97.7  PLT 132*   INR: Recent Labs  02/01/15 0326  INR 1.12   Basic Metabolic Panel: Recent Labs  01/31/15 1352 02/01/15 0326  NA 138 139  K 5.1 4.6  CL 104 104  CO2 27 28  GLUCOSE 184* 220*  BUN 35* 37*  CREATININE 2.69* 2.64*  CALCIUM 9.4 8.8*    TELE: NSR with rate in 50's - 80's. No atopic events.       ECG: NSR, rate in 60's. No acute changes.  Current Medications:  . carvedilol  25 mg Oral BID  . enoxaparin (LOVENOX) injection  40 mg Subcutaneous Q24H  . insulin aspart  0-15 Units Subcutaneous TID WC  . multivitamin with minerals  1 tablet Oral Daily  . simvastatin  40 mg Oral QHS  . sodium chloride  3 mL Intravenous Q12H   . sodium chloride 75 mL/hr at 01/31/15 1845    ASSESSMENT AND PLAN:  1. Unstable Angina - seen in November for worsening dyspnea with exertion and an "electric sensation" going from his left chest down his left arm. NST was recommended which showed a large defect of moderate severity present in the basal  anterior, basal anterolateral, mid anterior, mid anteroseptal, apical anterior, apical septal and apex location, consistent with ischemia in the LAD territory. - Has been receiving 75cc/hr for hydration. Cath planned for today. - continue BB and statin  2. CKD (chronic kidney disease), stage IV (HCC) - creatinine 2.62 on 12/26/2014. 2.64 on 02/01/2015. - has been pre-hydrated for cardiac catheterization today. - continue to monitor with daily BMET  3. DM (diabetes mellitus), type 2 (HCC) - SSI while admitted  4. Hyperlipidemia - continue statin  5. Chronic diastolic CHF (congestive heart failure), NYHA class 2 (HCC) - EF 55-60% with Grade 1 DD noted on echo in 2015. - Has trace lower extremity edema  but does not appear volume overloaded on exam - holding home Lasix in preparation for cath.  6. HTN - Losartan held in preparation for cath. - BP has been elevated in the 160's -190's. Have ordered PRN Hydralazine until his home medications can be resumed. - continue BB  Signed, Ellsworth Lennox , PA-C 7:52 AM 02/01/2015 Pager: [phone]  Attending Note:   The patient was seen and examined.  Agree with assessment and plan as noted above.  Changes made to the above note as needed.  No episodes of CP .   Ready for cath  creatinine is stable ( slightly improved ) from previous outside labs  Vesta Mixer, Montez Hageman., MD, [facility] 02/01/2015, 10:46 AM [address]  [address] Office [phone] Pager [phone]

## 2015-02-02 ENCOUNTER — Telehealth: Payer: Self-pay | Admitting: Cardiovascular Disease

## 2015-02-02 ENCOUNTER — Encounter (HOSPITAL_COMMUNITY): Payer: Self-pay | Admitting: Interventional Cardiology

## 2015-02-02 LAB — POCT I-STAT 3, VENOUS BLOOD GAS (G3P V)
BICARBONATE: 26.4 meq/L — AB (ref 20.0–24.0)
O2 SAT: 67 %
PO2 VEN: 37 mmHg (ref 30.0–45.0)
TCO2: 28 mmol/L (ref 0–100)
pCO2, Ven: 47.7 mmHg (ref 45.0–50.0)
pH, Ven: 7.351 — ABNORMAL HIGH (ref 7.250–7.300)

## 2015-02-02 NOTE — Telephone Encounter (Signed)
New problem    Pt has chronic kidney disease and discharge medication order wasn't followed through with pt, pt and wife are very confused and don't want to speak to the navigator nurse but dr Harvie Bridgenahser's nurse and Toniann FailWendy  need to speak to nurse concerning this matter to explain.

## 2015-02-02 NOTE — Telephone Encounter (Signed)
Toniann FailWendy the navigator nurse at [facility], needs our office to call patient and clarify discharge instructions.  Called patient. Explained to the patient that he was suppose to stop his lasix and losartan per his discharge instructions. Patient wants to know how long he has to be off of these medications. Informed patient that he has lab work tomorrow and from those results Dr. Elease HashimotoNahser may or may not make medication changes. Patient stated that he understood and will follow up with Dr. Elease HashimotoNahser in January.

## 2015-02-02 NOTE — Telephone Encounter (Signed)
We want to hold the lasix and losartan until Friday . We will get lab work on Friday and decide whether he can restart

## 2015-02-03 ENCOUNTER — Other Ambulatory Visit (INDEPENDENT_AMBULATORY_CARE_PROVIDER_SITE_OTHER): Payer: Medicare Other | Admitting: *Deleted

## 2015-02-03 DIAGNOSIS — I5032 Chronic diastolic (congestive) heart failure: Secondary | ICD-10-CM | POA: Diagnosis not present

## 2015-02-03 LAB — BASIC METABOLIC PANEL
BUN: 30 mg/dL — AB (ref 7–25)
CALCIUM: 9.5 mg/dL (ref 8.6–10.3)
CO2: 27 mmol/L (ref 20–31)
CREATININE: 2.33 mg/dL — AB (ref 0.70–1.11)
Chloride: 106 mmol/L (ref 98–110)
Glucose, Bld: 148 mg/dL — ABNORMAL HIGH (ref 65–99)
Potassium: 4.6 mmol/L (ref 3.5–5.3)
Sodium: 139 mmol/L (ref 135–146)

## 2015-02-06 ENCOUNTER — Telehealth: Payer: Self-pay | Admitting: Nurse Practitioner

## 2015-02-06 DIAGNOSIS — I2510 Atherosclerotic heart disease of native coronary artery without angina pectoris: Secondary | ICD-10-CM

## 2015-02-06 DIAGNOSIS — N189 Chronic kidney disease, unspecified: Secondary | ICD-10-CM

## 2015-02-06 MED ORDER — FUROSEMIDE 20 MG PO TABS: 60.0000 mg | ORAL_TABLET | Freq: Every day | ORAL | Status: AC

## 2015-02-06 NOTE — Telephone Encounter (Signed)
[place] called [place]  I spoke with Dr. Elease HashimotoNahser who is in the office today and he advised that patient may fly whenever he wants and we will arrange lab appointment around his schedule.  I advised patient to call me back when he is aware of travel plans.  He verbalized understanding and agreement.

## 2015-02-06 NOTE — Telephone Encounter (Signed)
-----   Message from Vesta MixerPhilip J Nahser, MD sent at 02/03/2015  3:59 PM EST ----- This looks great.   Creatinine is better.

## 2015-02-06 NOTE — Telephone Encounter (Signed)
Spoke with patient's wife and reviewed lab results.  She states that when patient was discharged from the hospital, the discharge instructions were not clear.  The patient did not understand to hold the Losartan and Lasix due to elevated creatinine and he resumed them upon d/c after holding for 48 hours before the cardiac cath.  I advised patient's wife that I would discuss with Dr. Elease HashimotoNahser, who is in the office today, and will call back with his advice.  She verbalized understanding and agreement. I called back and spoke with patient.  I advised that Dr. Elease HashimotoNahser recommends he resume lasix 60 mg daily and come in for repeat bmet on 12/27.   I advised that at this time he does not want him to restart Losartan.  I advised that Dr. Elease HashimotoNahser will review lab work next week and we will call him with advice at that time.  Patient verbalized understanding and agreement with plan.

## 2015-02-11 ENCOUNTER — Telehealth: Payer: Self-pay | Admitting: Physician Assistant

## 2015-02-11 NOTE — Telephone Encounter (Signed)
Patient called regarding dizziness and hypotension. When asked about his BP, he says his SBP was 170 this morning. He took coreg 25mg  and his BP went down to 110 and HR 40s. He's getting some dizziness.   I have instructed him to cut coreg by half to 12.5mg  BID. That should help with his drop in BP and HR. He had a cath in Nov which showed nonobstructive CAD. Hopefully, cutting coreg down by half will help with HR as well.   Ramond DialSigned, Zacharius Funari PA Pager: [phone]

## 2015-02-14 ENCOUNTER — Telehealth: Payer: Self-pay | Admitting: *Deleted

## 2015-02-14 ENCOUNTER — Other Ambulatory Visit (INDEPENDENT_AMBULATORY_CARE_PROVIDER_SITE_OTHER): Payer: Medicare Other | Admitting: *Deleted

## 2015-02-14 DIAGNOSIS — I2510 Atherosclerotic heart disease of native coronary artery without angina pectoris: Secondary | ICD-10-CM

## 2015-02-14 DIAGNOSIS — N189 Chronic kidney disease, unspecified: Secondary | ICD-10-CM | POA: Diagnosis not present

## 2015-02-14 LAB — BASIC METABOLIC PANEL
BUN: 29 mg/dL — ABNORMAL HIGH (ref 7–25)
CHLORIDE: 101 mmol/L (ref 98–110)
CO2: 29 mmol/L (ref 20–31)
CREATININE: 2.61 mg/dL — AB (ref 0.70–1.11)
Calcium: 9.8 mg/dL (ref 8.6–10.3)
Glucose, Bld: 162 mg/dL — ABNORMAL HIGH (ref 65–99)
POTASSIUM: 4.8 mmol/L (ref 3.5–5.3)
SODIUM: 137 mmol/L (ref 135–146)

## 2015-02-14 NOTE — Addendum Note (Signed)
Addended by: Alexx Giambra K on: 02/14/2015 08:04 AM   Modules accepted: Orders  

## 2015-02-14 NOTE — Telephone Encounter (Signed)
Patient came in for BMET redraw.  Had called on 12/24 with bradycardia and hypotension.  Coreg reduced to 12.5mg  bid.  Patient wanted to talk to nurse about b/p readings and said that he was concerned by their inconsistency.   02/11/15   110/56 44, 110/56 42, 146/67 44, 176/83 62;  02/12/15   198/110 50, 158/88 67, 118/67 64, 180/96 61; 02/13/15   145/83 67, 139/83 77, 149/83 46, 180/95 62,  02/14/15  170/95 65.  Today in clinic b/p  128/71  HR 72,   Patient not c/o sob, cp, still has some dizziness off and on.  Advised patient that once his labs are available that he will be called back.

## 2015-02-14 NOTE — Telephone Encounter (Signed)
BP variability is comment and we almost never find an reason for this Continue same meds.  Continue to monitor

## 2015-02-16 NOTE — Telephone Encounter (Signed)
Spoke with patient and reviewed Dr. Harvie BridgeNahser's advice.  He states BP today is 156/93 mmHg, pulse 61 bpm.  He reports less dizziness since reducing carvedilol to 12.5 mg bid.  I advised him that his home bp cuff may be reading higher than our manual cuff as his reading in the office on Tuesday was excellent.  I advised him to continue current carvedilol dose and call back if dizziness returns.  I advised him he is cleared to travel.  He states [place].  I advised him to call back with questions or concerns.  He verbalized understanding and agreement.

## 2015-02-22 ENCOUNTER — Other Ambulatory Visit: Payer: Self-pay | Admitting: *Deleted

## 2015-02-22 MED ORDER — SIMVASTATIN 40 MG PO TABS: 40.0000 mg | ORAL_TABLET | Freq: Every day | ORAL | Status: AC

## 2015-03-09 ENCOUNTER — Encounter: Payer: Self-pay | Admitting: Cardiovascular Disease

## 2015-03-09 ENCOUNTER — Ambulatory Visit (INDEPENDENT_AMBULATORY_CARE_PROVIDER_SITE_OTHER): Payer: Medicare Other | Admitting: Cardiovascular Disease

## 2015-03-09 VITALS — BP 142/70 | HR 52 | Ht 62.0 in | Wt 233.8 lb

## 2015-03-09 DIAGNOSIS — I5032 Chronic diastolic (congestive) heart failure: Secondary | ICD-10-CM

## 2015-03-09 NOTE — Progress Notes (Signed)
Roy Kane Date of Birth  05-09-1932       [facility] Office [address], [address], [place], [place]  [address]   [place], [place]  [address] [phone]     [phone]   Fax  [phone]    Fax [phone]  Problem List: 1. Diastolic congestive heart failure 2. Obesity 3. Renal cell carcinoma-status post nephrectomy 4. Diabetes mellitus 5. Hypertension  History of Present Illness:  Roy [place] has been having right arm and elbow pain. He has had some dizziness - especially in the morning, ( orthostatic hypotension).  After he's had breakfast and has had something to drink he feels better. He feels quite well through the day.  He has not been exercising. He is fatigued and short of breath with any sort of exercise. He does not have any episodes of chest pain.  Oct. 8, 2013 Roy [place] is still having some dyspnea.  He also has had a cough  - lasts all day and night.  He is on Lisinopril.  He still has some dizziness - doesn't feel comfortable with walking and as a result he is not exercising.  He thinks this is at least partially due to his loss of eyesite and lack of peripheral vision.  February 27, 2012: He has had a chronic cough.  We DCd his ACE inhibitor and started Losartan but this did not help.  He had a lung CT which revealed significant opacification on the left side of his lung.  August 14, 2012:  [place] is doing OK.  His breathing is OK.   May 14, 2012:   Roy [place] is upset today because he was never called or notified about his appointment.  He is not doing well.  Has a chronic cough.  Especially at night.  Productive of white sputum.  Also is very short of breath. His left leg is  very edematous now.   He fell on his left side/ leg  and now has profound left leg edema.  He has seen ortho - was told   He went to visit his son in [place] ( 10,000 feet in elevation ) had significant dyspnea.    July 29, 2013;  He  has persistent DOE.  He has severe restrictive lung disease.   Also has some dizziness.  He has lost some weight.   His BP has been ok.  His blood pressure today is fairly elevated. He's been under lots of stress. His wife is over in [place] using her brother who is dying. His blood pressure readings at home have been relatively well-controlled.  He has a chronic cough - productive of clear sputum.  No fever.   Oct. 5, 2015:  [place] is about the same from a cardiac standpoint. He still has a chronic cough. He visited his son in [place]  had loss of shortness of breath - cabin was at 10,000 feet.     Nov. 15, 2016:  Has not been doing well Has had worsening shortness of breath.  Has had some dizziness,  Unsteadiness with walking  Some of his symptoms are c/w orthostasis. Friday , he was cooking.  Had some left sided arm  " electric feeling"  Associated with electric sensation in his left side of his chest  Lasted 15 - 20 min.   Associated with some shortness of breath . This is the 2nd time in the past several weeks.  Jan. 19, 2017Anna Kane had a cath since his last office visit that  Showed smooth and normal coronary arteries. A stat well since that time. He's not had any further episodes of chest pain. Renal function has been stable.   Will be seeing his nephrologist  He is on the rice diet.   losing weight,  Feeling well.  Wt Readings from Last 3 Encounters:  03/09/15 233 lb 12.8 oz (106.051 kg)  02/01/15 235 lb 14.3 oz (107 kg)  01/26/15 241 lb (109.317 kg)      Current Outpatient Prescriptions on File Prior to Visit  Medication Sig Dispense Refill  . Blood Glucose Monitoring Suppl (ONE TOUCH ULTRA SYSTEM KIT) W/DEVICE KIT Use as instructed    . budesonide-formoterol (SYMBICORT) 160-4.5 MCG/ACT inhaler Inhale 1 puff into the lungs 2 (two) times daily as needed (shortness of breath).    . calcitRIOL (ROCALTROL) 0.5 MCG capsule Take 0.5 mcg by mouth 2 (two) times a week. Monday  and Friday    . carvedilol (COREG) 25 MG tablet Take 1 tablet (25 mg total) by mouth 2 (two) times daily with a meal. Patient must have appointment  before any more rx call be refill. (Patient taking differently: Take 12.5 mg by mouth 2 (two) times daily with a meal. Patient must have appointment  before any more rx call be refill.) 60 tablet 0  . furosemide (LASIX) 20 MG tablet Take 3 tablets (60 mg total) by mouth daily. 90 tablet 11  . insulin aspart (NOVOLOG FLEXPEN) 100 UNIT/ML FlexPen Inject 10 Units into the skin 3 (three) times daily with meals.    . insulin glargine (LANTUS) 100 unit/mL SOPN Inject 20 Units into the skin daily before breakfast.    . Multiple Vitamin (MULTIVITAMIN WITH MINERALS) TABS tablet Take 1 tablet by mouth daily.    . simvastatin (ZOCOR) 40 MG tablet Take 1 tablet (40 mg total) by mouth daily. . 90 tablet 3   No current facility-administered medications on file prior to visit.    Allergies  Allergen Reactions  . Lisinopril Cough  . Neomycin-Polymyxin-Dexameth Swelling    REACTION:  Maxitrol eye ointment caused eye swelling  . Sulfamethoxazole Swelling and Other (See Comments)    lethargic  . Tape Rash    tears skin - paper tape OK    Past Medical History  Diagnosis Date  . Obesity, morbid ([place])   . Diastolic dysfunction     CHRONIC  . Renal cell carcinoma 2012    S/p partial nephrectomy  . Hyperkalemia   . Hypertension   . Eye infection     CHRONIC LEFT EYE  . Venous insufficiency   . Shortness of breath dyspnea   . Diabetes mellitus     INSULIN DEPENDENT  . Arthritis   . CAD (coronary artery disease)     a. 01/2015: LHC after false + stress test with non obstructive dz and widely patent coronary arteries     Past Surgical History  Procedure Laterality Date  . Total knee arthroplasty    . Hernia repair      BILATERAL  . Prostate surgery    . Gallbladder surgery    . Enucleation Left ? 2001  . Cardiac catheterization N/A 02/01/2015     Procedure: Right/Left Heart Cath and Coronary Angiography;  Surgeon: Belva Crome, MD;  Location: [facility];  Service: Cardiovascular;  Laterality: N/A;    History  Smoking status  . Former Smoker --  1.50 packs/day for 25 years  . Types: Cigarettes  . Quit date: 02/19/1980  Smokeless tobacco  . Never Used    History  Alcohol Use No    Family History  Problem Relation Age of Onset  . Heart disease Father     CHF  . Cancer Paternal Grandmother     Reviw of Systems:  Reviewed in the HPI.  All other systems are negative.  Physical Exam: Blood pressure 142/70, pulse 52, height _0  (1.575 m), weight 233 lb 12.8 oz (106.051 kg). General: Well developed, well nourished, in no acute distress.  Head: Normocephalic, atraumatic, sclera non-icteric, mucus membranes are moist,   Neck: Supple. Carotids are 2 + without bruits. No JVD  Lungs: Clear bilaterally to auscultation.  Heart: regular rate.  normal  S1 S2. No murmurs, gallops or rubs.  Abdomen: Soft, non-tender, non-distended with normal bowel sounds. No hepatomegaly. No rebound/guarding. No masses.  Msk:  Strength and tone are normal  Extremities: No clubbing or cyanosis. 1+2 edema in left leg. Trace on right..  Distal pedal pulses are 1+ and equal bilaterally.  Neuro: Alert and oriented X 3. Moves all extremities spontaneously.  Psych:  Responds to questions appropriately with a normal affect.  ECG:  01/03/2015: Normal sinus rhythm with sinus arrhythmia. His heart rate is 70. EKG is normal.  Assessment / Plan:   1. Diastolic congestive heart failure - very stable ,   Continue meds.  2. Obesity - has been losing weight  3. Renal cell carcinoma-status post nephrectomy 4. Diabetes mellitus 5. Hypertension 6.  Chest discomfort:   He had a cath that revealed normal coronaries.   continue to follow    Quinteria Chisum, Wonda Cheng, MD  03/09/2015 2:33 PM    [address],   [place], [place]  [address] Pager [phone] Phone: [phone]; Fax: [phone]

## 2015-03-09 NOTE — Patient Instructions (Signed)

## 2015-04-07 ENCOUNTER — Ambulatory Visit: Payer: Medicare Other | Admitting: Cardiovascular Disease

## 2015-04-11 ENCOUNTER — Ambulatory Visit (HOSPITAL_BASED_OUTPATIENT_CLINIC_OR_DEPARTMENT_OTHER): Payer: Medicare Other

## 2015-05-02 ENCOUNTER — Other Ambulatory Visit: Payer: Self-pay

## 2015-05-04 ENCOUNTER — Other Ambulatory Visit: Payer: Self-pay | Admitting: Nurse Practitioner

## 2015-05-04 MED ORDER — CARVEDILOL 25 MG PO TABS: 12.5000 mg | ORAL_TABLET | Freq: Two times a day (BID) | ORAL | Status: DC

## 2015-09-06 ENCOUNTER — Encounter: Payer: Self-pay | Admitting: Cardiovascular Disease

## 2015-09-06 ENCOUNTER — Ambulatory Visit (INDEPENDENT_AMBULATORY_CARE_PROVIDER_SITE_OTHER): Payer: Medicare Other | Admitting: Cardiovascular Disease

## 2015-09-06 VITALS — BP 164/100 | HR 60 | Ht 62.0 in | Wt 233.1 lb

## 2015-09-06 DIAGNOSIS — I10 Essential (primary) hypertension: Secondary | ICD-10-CM

## 2015-09-06 DIAGNOSIS — I5032 Chronic diastolic (congestive) heart failure: Secondary | ICD-10-CM

## 2015-09-06 NOTE — Progress Notes (Signed)
[name] Date of Birth  05-09-1932       [facility] Office [address], [address], [place], [place]  [address]   [place], [place]  [address] [phone]     [phone]   Fax  [phone]    Fax [phone]  Problem List: 1. Diastolic congestive heart failure 2. Obesity 3. Renal cell carcinoma-status post nephrectomy 4. Diabetes mellitus 5. Hypertension  History of Present Illness:  [place] has been having right arm and elbow pain. He has had some dizziness - especially in the morning, ( orthostatic hypotension).  After he's had breakfast and has had something to drink he feels better. He feels quite well through the day.  He has not been exercising. He is fatigued and short of breath with any sort of exercise. He does not have any episodes of chest pain.  Oct. 8, 2013 [name] is still having some dyspnea.  He also has had a cough  - lasts all day and night.  He is on Lisinopril.  He still has some dizziness - doesn't feel comfortable with walking and as a result he is not exercising.  He thinks this is at least partially due to his loss of eyesite and lack of peripheral vision.  February 27, 2012: He has had a chronic cough.  We DCd his ACE inhibitor and started Losartan but this did not help.  He had a lung CT which revealed significant opacification on the left side of his lung.  August 14, 2012:  [name] is doing OK.  His breathing is OK.   May 14, 2012:   Aren is upset today because he was never called or notified about his appointment.  He is not doing well.  Has a chronic cough.  Especially at night.  Productive of white sputum.  Also is very short of breath. His left leg is  very edematous now.   He fell on his left side/ leg  and now has profound left leg edema.  He has seen ortho - was told   He went to visit his son in [place] ( 10,000 feet in elevation ) had significant dyspnea.    July 29, 2013;  He  has persistent DOE.  He has severe restrictive lung disease.   Also has some dizziness.  He has lost some weight.   His BP has been ok.  His blood pressure today is fairly elevated. He's been under lots of stress. His wife is over in [place] using her brother who is dying. His blood pressure readings at home have been relatively well-controlled.  He has a chronic cough - productive of clear sputum.  No fever.   Oct. 5, 2015:  [name] is about the same from a cardiac standpoint. He still has a chronic cough. He visited his son in [place]  had loss of shortness of breath - cabin was at [place],000 [place].     Nov. 15, 2016:  Has not been doing well Has had worsening shortness of breath.  Has had some dizziness,  Unsteadiness with walking  Some of his symptoms are c/w orthostasis. Friday , he was cooking.  Had some left sided arm  " electric feeling"  Associated with electric sensation in his left side of his chest  Lasted 15 - 20 min.   Associated with some shortness of breath . This is the 2nd time in the past several weeks.  Jan. 19, 2017[name] had a cath since his last office visit that  Showed smooth and normal coronary arteries. A stat well since that time. He's not had any further episodes of chest pain. Renal function has been stable.   Will be seeing his nephrologist  He is on the rice diet.   losing weight,  Feeling well.   September 06, 2015:   [name] is seen today .    Was hospitalized in [place] - had an infection behind his left eye prosthesis. Received IV abx  Had some issues with his renal function Was in hospital for 5 days. Still on PO anticiotic  -  No CP , Has chronic dyspnea  His lasix does and his coreg dose were lowered.      Current Outpatient Prescriptions on File Prior to Visit  Medication Sig Dispense Refill  . Blood Glucose Monitoring Suppl (ONE TOUCH ULTRA SYSTEM KIT) W/DEVICE KIT Use as instructed    . calcitRIOL (ROCALTROL) 0.5 MCG capsule Take 0.5 mcg by mouth  2 (two) times a week. Monday and Friday    . carvedilol (COREG) 25 MG tablet Take 0.5 tablets (12.5 mg total) by mouth 2 (two) times daily with a meal. Patient must have appointment  before any more rx call be refill. 30 tablet 11  . furosemide (LASIX) 20 MG tablet Take 3 tablets (60 mg total) by mouth daily. 90 tablet 11  . insulin aspart (NOVOLOG FLEXPEN) 100 UNIT/ML FlexPen Inject 10 Units into the skin 3 (three) times daily with meals.    . insulin glargine (LANTUS) 100 unit/mL SOPN Inject 20 Units into the skin daily before breakfast.    . Multiple Vitamin (MULTIVITAMIN WITH MINERALS) TABS tablet Take 1 tablet by mouth daily.    . simvastatin (ZOCOR) 40 MG tablet Take 1 tablet (40 mg total) by mouth daily. . 90 tablet 3   No current facility-administered medications on file prior to visit.    Allergies  Allergen Reactions  . Lisinopril Cough  . Neomycin-Polymyxin-Dexameth Swelling    REACTION:  Maxitrol eye ointment caused eye swelling  . Sulfamethoxazole Swelling and Other (See Comments)    lethargic  . Tape Rash    tears skin - paper tape OK    Past Medical History  Diagnosis Date  . Obesity, morbid ([place])   . Diastolic dysfunction     CHRONIC  . Renal cell carcinoma 2012    S/p partial nephrectomy  . Hyperkalemia   . Hypertension   . Eye infection     CHRONIC LEFT EYE  . Venous insufficiency   . Shortness of breath dyspnea   . Diabetes mellitus     INSULIN DEPENDENT  . Arthritis   . CAD (coronary artery disease)     a. 01/2015: LHC after false + stress test with non obstructive dz and widely patent coronary arteries     Past Surgical History  Procedure Laterality Date  . Total knee arthroplasty    . Hernia repair      BILATERAL  . Prostate surgery    . Gallbladder surgery    . Enucleation Left ? 2001  . Cardiac catheterization N/A 02/01/2015    Procedure: Right/Left Heart Cath and Coronary Angiography;  Surgeon: Belva Crome, MD;  Location: [facility];  Service: Cardiovascular;  Laterality: N/A;    History  Smoking status  . Former Smoker -- 1.50 packs/day for 25 years  . Types: Cigarettes  .  Quit date: 02/19/1980  Smokeless tobacco  . Never Used    History  Alcohol Use No    Family History  Problem Relation Age of Onset  . Heart disease Father     CHF  . Cancer Paternal Grandmother     Reviw of Systems:  Reviewed in the HPI.  All other systems are negative.  Physical Exam: Blood pressure 164/100, pulse 60, height '5\' 2"'$  (1.575 m), weight 233 lb 1.9 oz (105.743 kg). General: Well developed, well nourished, in no acute distress. Head: Normocephalic, atraumatic, sclera non-icteric, mucus membranes are moist,  Neck: Supple. Carotids are 2 + without bruits. No JVD Lungs: Clear bilaterally to auscultation. Heart: regular rate.  normal  S1 S2. No murmurs, gallops or rubs. Abdomen: Soft, non-tender, non-distended with normal bowel sounds. No hepatomegaly. No rebound/guarding. No masses. Msk:  Strength and tone are normal Extremities: No clubbing or cyanosis. 1+2 edema in left leg. Trace on right..  Distal pedal pulses are 1+ and equal bilaterally. Neuro: Alert and oriented X 3. Moves all extremities spontaneously. Psych:  Responds to questions appropriately with a normal affect.  ECG:   Assessment / Plan:   1. Diastolic congestive heart failure - very stable ,   Continue meds.  2. Obesity -  3. Renal cell carcinoma-status post nephrectomy 4. Diabetes mellitus 5. Hypertension - BP is a bit elevated.  Will allow him to get over this recent hospitalization and will see him back in 3 months .   6.  Chest discomfort:   He had a cath that revealed normal coronaries.   continue to follow    Mertie Moores, MD  09/06/2015 12:08 PM    [address],  [place], [place]  [address] Pager [phone] Phone: [phone]; Fax: [phone]

## 2015-09-06 NOTE — Patient Instructions (Addendum)
Medication Instructions:  Your physician recommends that you continue on your current medications as directed. Please refer to the Current Medication list given to you today.   Labwork: None Ordered   Testing/Procedures: None Ordered   Follow-Up: Your physician recommends that you return for a follow-up appointment on Friday October 20 at 1:45 pm   If you need a refill on your cardiac medications before your next appointment, please call your pharmacy.   Thank you for choosing [facility]! Eligha BridegroomMichelle Swinyer, RN [phone]

## 2015-11-27 ENCOUNTER — Encounter: Payer: Self-pay | Admitting: Cardiovascular Disease

## 2015-12-08 ENCOUNTER — Ambulatory Visit (INDEPENDENT_AMBULATORY_CARE_PROVIDER_SITE_OTHER): Payer: Medicare Other | Admitting: Cardiovascular Disease

## 2015-12-08 ENCOUNTER — Encounter: Payer: Self-pay | Admitting: Cardiovascular Disease

## 2015-12-08 VITALS — BP 130/80 | HR 58 | Ht 63.0 in | Wt 230.4 lb

## 2015-12-08 DIAGNOSIS — I10 Essential (primary) hypertension: Secondary | ICD-10-CM | POA: Diagnosis not present

## 2015-12-08 DIAGNOSIS — I5032 Chronic diastolic (congestive) heart failure: Secondary | ICD-10-CM | POA: Diagnosis not present

## 2015-12-08 NOTE — Progress Notes (Signed)
Roy Kane Date of Birth  05-09-1932       [facility] Office [address], [address] [place], [place], [place]  [address]   [place], [place]  [phone]     [phone]   Fax  [phone]    Fax [phone]  Problem List: 1. Diastolic congestive heart failure 2. Obesity 3. Renal cell carcinoma-status post nephrectomy 4. Diabetes mellitus 5. Hypertension  History of Present Illness:  Roy Kane has been having right arm and elbow pain. He has had some dizziness - especially in the morning, ( orthostatic hypotension).  After he's had breakfast and has had something to drink he feels better. He feels quite well through the day.  He has not been exercising. He is fatigued and short of breath with any sort of exercise. He does not have any episodes of chest pain.  Oct. 8, 2013 Roy Kane is still having some dyspnea.  He also has had a cough  - lasts all day and night.  He is on Lisinopril.  He still has some dizziness - doesn't feel comfortable with walking and as a result he is not exercising.  He thinks this is at least partially due to his loss of eyesite and lack of peripheral vision.  February 27, 2012: He has had a chronic cough.  We DCd his ACE inhibitor and started Losartan but this did not help.  He had a lung CT which revealed significant opacification on the left side of his lung.  August 14, 2012:  Roy Kane is doing OK.  His breathing is OK.   May 14, 2012:   Roy Kane is upset today because he was never called or notified about his appointment.  He is not doing well.  Has a chronic cough.  Especially at night.  Productive of white sputum.  Also is very short of breath. His left leg is  very edematous now.   He fell on his left side/ leg  and now has profound left leg edema.  He has seen ortho - was told   He went to visit his son in [place] ( [place],000 [place] in elevation ) had significant dyspnea.    July 29, 2013;  He  has persistent DOE.  He has severe restrictive lung disease.   Also has some dizziness.  He has lost some weight.   His BP has been ok.  His blood pressure today is fairly elevated. He's been under lots of stress. His wife is over in [place] using her brother who is dying. His blood pressure readings at home have been relatively well-controlled.  He has a chronic cough - productive of clear sputum.  No fever.   Oct. 5, 2015:  Roy Kane is about the same from a cardiac standpoint. He still has a chronic cough. He visited his son in [place]  had loss of shortness of breath - [place] was [place].     Nov. 15, 2016:  Has not been doing well Has had worsening shortness of breath.  Has had some dizziness,  Unsteadiness with walking  Some of his symptoms are c/w orthostasis. Friday , he was cooking.  Had some left sided arm  " electric feeling"  Associated with electric sensation in his left side of his chest  Lasted 15 - 20 min.   Associated with some shortness of breath . This is the 2nd time in the past several weeks.  Jan. 19, 2017Anna Kane had a cath since his last office visit that  Showed smooth and normal coronary arteries. A stat well since that time. He's not had any further episodes of chest pain. Renal function has been stable.   Will be seeing his nephrologist  He is on the rice diet.   losing weight,  Feeling well.   September 06, 2015:   [place] is seen today .    Was hospitalized in [place] - had an infection behind his left eye prosthesis. Received IV abx  Had some issues with his renal function Was in hospital for 5 days. Still on PO anticiotic  -  No CP , Has chronic dyspnea  His lasix does and his coreg dose were lowered.   Oct. 20, 2017:  Is doing well. No CP or dyspnea.     Current Outpatient Prescriptions on File Prior to Visit  Medication Sig Dispense Refill  . Blood Glucose Monitoring Suppl (ONE TOUCH ULTRA SYSTEM KIT) W/DEVICE KIT Use as instructed    .  calcitRIOL (ROCALTROL) 0.5 MCG capsule Take 0.5 mcg by mouth 2 (two) times a week. Monday and Friday    . doxazosin (CARDURA) 8 MG tablet Take 8 mg by mouth daily.    . furosemide (LASIX) 20 MG tablet Take 3 tablets (60 mg total) by mouth daily. 90 tablet 11  . insulin aspart (NOVOLOG FLEXPEN) 100 UNIT/ML FlexPen Inject 10 Units into the skin 3 (three) times daily with meals.    . insulin glargine (LANTUS) 100 unit/mL SOPN Inject 20 Units into the skin daily before breakfast.    . Multiple Vitamin (MULTIVITAMIN WITH MINERALS) TABS tablet Take 1 tablet by mouth daily.    . simvastatin (ZOCOR) 40 MG tablet Take 1 tablet (40 mg total) by mouth daily. . 90 tablet 3   No current facility-administered medications on file prior to visit.     Allergies  Allergen Reactions  . Lisinopril Cough  . Neomycin-Polymyxin-Dexameth Swelling    REACTION:  Maxitrol eye ointment caused eye swelling  . Sulfamethoxazole Swelling and Other (See Comments)    lethargic  . Tape Rash    tears skin - paper tape OK    Past Medical History:  Diagnosis Date  . Arthritis   . CAD (coronary artery disease)    a. 01/2015: LHC after false + stress test with non obstructive dz and widely patent coronary arteries   . Diabetes mellitus    INSULIN DEPENDENT  . Diastolic dysfunction    CHRONIC  . Eye infection    CHRONIC LEFT EYE  . Hyperkalemia   . Hypertension   . Obesity, morbid ([place])   . Renal cell carcinoma 2012   S/p partial nephrectomy  . Shortness of breath dyspnea   . Venous insufficiency     Past Surgical History:  Procedure Laterality Date  . CARDIAC CATHETERIZATION N/A 02/01/2015   Procedure: Right/Left Heart Cath and Coronary Angiography;  Surgeon: Belva Crome, MD;  Location: [facility];  Service: Cardiovascular;  Laterality: N/A;  . ENUCLEATION Left ? 2001  . GALLBLADDER SURGERY    . HERNIA REPAIR     BILATERAL  . PROSTATE SURGERY    . TOTAL KNEE ARTHROPLASTY      History   Smoking Status  . Former Smoker  . Packs/day: 1.50  . Years: 25.00  . Types: Cigarettes  . Quit date: 02/19/1980  Smokeless Tobacco  . Never Used    History  Alcohol Use No    Family History  Problem Relation Age of Onset  . Cancer Paternal Grandmother   . Heart disease Father     CHF    Reviw of Systems:  Reviewed in the HPI.  All other systems are negative.  Physical Exam: Blood pressure 130/80, pulse (!) 58, height '5\' 3"'$  (1.6 m), weight 230 lb 6.4 oz (104.5 kg). General: Well developed, well nourished, in no acute distress. Head: Normocephalic, atraumatic, sclera non-icteric, mucus membranes are moist,  Neck: Supple. Carotids are 2 + without bruits. No JVD Lungs: Clear bilaterally to auscultation. Heart: regular rate.  normal  S1 S2. No murmurs, gallops or rubs. Abdomen: Soft, non-tender, non-distended with normal bowel sounds. No hepatomegaly. No rebound/guarding. No masses. Msk:  Strength and tone are normal Extremities: No clubbing or cyanosis. 1+2 edema in left leg. Trace on right..  Distal pedal pulses are 1+ and equal bilaterally. Neuro: Alert and oriented X 3. Moves all extremities spontaneously. Psych:  Responds to questions appropriately with a normal affect.  ECG:   Assessment / Plan:   1. Diastolic congestive heart failure - very stable ,   Continue meds.  No edema today   2. Obesity -  3. Renal cell carcinoma-status post nephrectomy 4. Diabetes mellitus 5. Hypertension - BP is a bit elevated initially but upon recheck, it had corrected to normal .   6.  Chest discomfort:   He had a cath that revealed normal coronaries.   continue to follow    Mertie Moores, MD  12/08/2015 2:20 PM    [address],  [place], [place]  [address] Pager [phone] Phone: [phone]; Fax: [phone]

## 2015-12-08 NOTE — Patient Instructions (Signed)
Medication Instructions:  Your physician recommends that you continue on your current medications as directed. Please refer to the Current Medication list given to you today.   Labwork: None Ordered   Testing/Procedures: None Ordered   Follow-Up: Your physician wants you to follow-up in: 6 months with Dr. Nahser.  You will receive a reminder letter in the mail two months in advance. If you don't receive a letter, please call our office to schedule the follow-up appointment.   If you need a refill on your cardiac medications before your next appointment, please call your pharmacy.   Thank you for choosing [facility]! Roy Snuffer, RN [phone]    

## 2016-03-21 DEATH — deceased
